# Patient Record
Sex: Male | Born: 1995 | Race: White | Hispanic: No | Marital: Single | Smoking: Former smoker
Health system: Southern US, Community
[De-identification: ages and names within clinical notes are randomized; demographics above are authoritative.]

## PROBLEM LIST (undated history)

## (undated) DIAGNOSIS — J45909 Unspecified asthma, uncomplicated: Secondary | ICD-10-CM

## (undated) HISTORY — PX: TONSILLECTOMY: SHX5217

## (undated) HISTORY — DX: Unspecified asthma, uncomplicated: J45.909

## (undated) HISTORY — PX: ADENOIDECTOMY: SUR15

---
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
## 2015-11-24 HISTORY — PX: OTHER SURGICAL HISTORY: SHX169

## 2020-04-16 NOTE — Progress Notes (Signed)
Phone: [phone]   Subjective:  Patient presents today to establish care.  Prior patient of PCP in [place]- does not recall name.  Chief Complaint  Patient presents with  . New Patient (Initial Visit)   See problem oriented charting  The following were reviewed and entered/updated in epic: Past Medical History:  Diagnosis Date  . Asthma    as a child   . Chiari I malformation (HCC) 2016   s/p decompression surgery with Dr. Thad Ranger In [place]. still does appointments there.    Patient Active Problem List   Diagnosis Date Noted  . Chiari I malformation (HCC) 2016   Past Surgical History:  Procedure Laterality Date  . ADENOIDECTOMY    . Chiari decompression  11/24/2015  . TONSILLECTOMY      Family History  Problem Relation Age of Onset  . Crohn's disease Mother   . Hypertension Father        not sure which it is HTN or HLD  . Hyperlipidemia Father        not close to father  . Hyperlipidemia Maternal Grandmother   . Other Maternal Grandfather        sepsis. and may have had heart related issues  . Other Paternal Grandmother        thinks pacemaker- limited relationship  . Other Paternal Grandfather        limited relationship    Medications- reviewed and updated Current Outpatient Medications  Medication Sig Dispense Refill  . ISOtretinoin (ACCUTANE) 40 MG capsule Take by mouth.    . triamcinolone (KENALOG) 0.1 % Apply 1 application topically 2 (two) times daily. For 7-10 days maximum 80 g 0   No current facility-administered medications for this visit.    Allergies-reviewed and updated Allergies  Allergen Reactions  . Amitriptyline     Other reaction(s):  auditory hallucinations after chiari malformation surgery  . Gabapentin     Other reaction(s): auditory hallucinations after chiari malformation surgery    Social History   Social History Narrative   Single. Lives with a roommate. 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    He is interested in Multimedia programmer estate and Production assistant, radio.       Hobbies: working out, time with friends, family, travel.     Objective  Objective:  BP 116/64   Pulse 67   Temp 98.5 F (36.9 C) (Temporal)   Ht 6' (1.829 m)   Wt 188 lb 6.4 oz (85.5 kg)   SpO2 95%   BMI 25.55 kg/m  Gen: NAD, resting comfortably Scar on back of scalp from prior Chiari malformation surgery noted HEENT: Mucous membranes are moist. Oropharynx normal. TM normal. Eyes: sclera and lids normal, PERRLA Neck: no thyromegaly, no cervical lymphadenopathy CV: RRR no murmurs rubs or gallops Lungs: CTAB no crackles, wheeze, rhonchi Abdomen: soft/nontender/nondistended/normal bowel sounds. No rebound or guarding.  Ext: no edema Skin: warm, dry, dry on back of bilateral hands with some excoriation Neuro: 5/5 strength in upper and lower extremities, normal gait, normal reflexes    Assessment and Plan:   #Bilateral hand dryness S Bilateral hand dryness- issues for about a week. History of eczema when younger- and gets heat rash at times too. ends up scratching while he sleeps. Hydrocortisone was helpful and seemed to help it scab over but wonders if drying it out. Also using aveeno with aloe.  A/P: Unclear etiology-potential irritant  at work.  No other new known contacts.  We will try to break out of itch scratch cycle-For the hands please apply triamcinolone twice daily and then cover with Aquaphor (reports breaking out with Vaseline in the past but has tolerated Aquaphor). Can reapply aquaphor if needed. Could have an irritant at work perhaps.  -Does have history of eczema as a child-may be a variant of that -Also discussed possibly referring back to his dermatologist if needed if fails to improve  #Insomnia #Fatigue S: Works 3- 11 PM and having a hard time getting sleep schedule on track- still exhausted during the day. Has been able to sleep 8 hours for 2 weeks but still  feels tired in the day. Tried modafinil in the past and seemed to help.   Fatigue during the day. 1-2 hours to get to sleep and may be on and off sleep.  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  I want to at least try melatonin 1 to 3 mg as an initial step and also per AVS did some counseling for sleep hygiene.  Also discussed possible referral to behavioral health for CBT-I. -We discussed if not improving within 2 or 3 weeks could try Ambien intermittently perhaps 2 or 3 days a week -He asks about Provigil but with prior auditory hallucinations this makes me more cautious  #Groin lesion -At base oft of penis has noted a small bump on and off like a pimple for a month- if taking turmeric seems to be better. Drank alcohol last night and seemed worse today.  On exam this appears to be an infected hair follicle-recommend warm compresses-given location do not think incision and drainage is ideal at this time.  No obvious inguinal lymphadenopathy.  if new or worsening symptoms should follow-up but I think this will gradually improve  #STD screening-patient reports using condoms regularly.  We opted for him to save 7 screen for STDs still.  On visual inspection no obvious STDs  #Chiari malformation-s/p surgery with Dr. Thad Ranger In [place]. still does appointments there.  Recommended patient continue regular follow-up -Apparently also has history of spinal fluid build up after chiari malformation surgery with Dr. Thad Ranger in [place]. had auditory hallucinations that resolved eventually.  We discussed potential causes-primarily simply thankful he is doing better.   Recommended follow up: 1 year physical unless other symptoms do not improve No future appointments.  Meds ordered this encounter  Medications  . triamcinolone (KENALOG) 0.1 %    Sig: Apply 1 application topically 2 (two) times daily. For 7-10 days maximum    Dispense:  80 g    Refill:  0   Time Spent: 49 minutes  of total time (11 AM-11:43 AM, 12: 41 PM-12:47 PM ) was spent on the date of the encounter performing the following actions: chart review prior to seeing the patient, obtaining history, performing a medically necessary exam, counseling on the treatment plan, placing orders, and documenting in our EHR.   Return precautions advised. Tana Conch, MD

## 2020-04-16 NOTE — Patient Instructions (Addendum)
Health Maintenance Due  Topic Date Due  . TETANUS/TDAP  Never done  . INFLUENZA VACCINE  Never done   Please stop by lab before you go If you have mychart- we will send your results within 3 business days of [place] receiving them.  If you do not have mychart- we will call you about results within 5 business days of [place] receiving them.  *please note we are currently using [facility] which has a longer processing time than [place] typically so labs may not come back as quickly as in the past *please also note that you will see labs on mychart as soon as they post. I will later go in and write notes on them- will say "notes from Dr. Durene Cal"  For the hands please apply triamcinolone twice daily and then cover with Aquaphor. Can reapply aquaphor if needed. Could have an irritant at work perhaps.   Try warm compresses on area in the groin- if this area gets bigger over time instead of improving- could refer to urology  Try melatonin in 1-3 mg range for up to 3 weeks before bed and see if that helps. If not effective call or send [place] a message on mychart.   Do not use your phone/computuer/tv 30 minutes to an hour before bed.  I like blue light glasses but even still better to just be off completely.   If you cant sleep after about 10 minutes get up and go to an area not in your bedroom until you feel sleepy- likely read a book.   Please call [phone] to schedule a visit with [facility] for insomnia related issues with Salomon Fick

## 2020-04-17 ENCOUNTER — Encounter: Payer: Self-pay | Admitting: Family Medicine

## 2020-04-17 ENCOUNTER — Other Ambulatory Visit (HOSPITAL_COMMUNITY)
Admission: RE | Admit: 2020-04-17 | Discharge: 2020-04-17 | Disposition: A | Discharge status: Home / Self Care | Payer: BLUE CROSS/BLUE SHIELD | Source: Ambulatory Visit | Attending: Family Medicine | Admitting: Family Medicine

## 2020-04-17 ENCOUNTER — Ambulatory Visit (INDEPENDENT_AMBULATORY_CARE_PROVIDER_SITE_OTHER): Payer: No Typology Code available for payment source | Admitting: Family Medicine

## 2020-04-17 ENCOUNTER — Other Ambulatory Visit: Payer: Self-pay

## 2020-04-17 VITALS — BP 116/64 | HR 67 | Temp 98.5°F | Ht 72.0 in | Wt 188.4 lb

## 2020-04-17 DIAGNOSIS — G935 Compression of brain: Secondary | ICD-10-CM

## 2020-04-17 DIAGNOSIS — Z114 Encounter for screening for human immunodeficiency virus [HIV]: Secondary | ICD-10-CM

## 2020-04-17 DIAGNOSIS — Z113 Encounter for screening for infections with a predominantly sexual mode of transmission: Secondary | ICD-10-CM

## 2020-04-17 DIAGNOSIS — R5383 Other fatigue: Secondary | ICD-10-CM

## 2020-04-17 DIAGNOSIS — Z118 Encounter for screening for other infectious and parasitic diseases: Secondary | ICD-10-CM

## 2020-04-17 DIAGNOSIS — Z13220 Encounter for screening for lipoid disorders: Secondary | ICD-10-CM

## 2020-04-17 DIAGNOSIS — Z1159 Encounter for screening for other viral diseases: Secondary | ICD-10-CM

## 2020-04-17 MED ORDER — TRIAMCINOLONE ACETONIDE 0.1 % EX CREA: 1.0000 "application " | TOPICAL_CREAM | Freq: Two times a day (BID) | CUTANEOUS | 0 refills | Status: DC

## 2020-04-17 NOTE — Addendum Note (Signed)
Addended by: Daryll Brod on: 04/17/2020 05:40 PM   Modules accepted: Orders

## 2020-04-20 LAB — URINE CYTOLOGY ANCILLARY ONLY
Chlamydia: NEGATIVE
Comment: NEGATIVE
Comment: NEGATIVE
Comment: NORMAL
Neisseria Gonorrhea: NEGATIVE
Trichomonas: NEGATIVE

## 2020-04-20 LAB — CBC WITH DIFFERENTIAL/PLATELET
Absolute Monocytes: 552 cells/uL (ref 200–950)
Basophils Absolute: 50 cells/uL (ref 0–200)
Basophils Relative: 0.8 %
Eosinophils Absolute: 87 cells/uL (ref 15–500)
Eosinophils Relative: 1.4 %
HCT: 43.3 % (ref 38.5–50.0)
Hemoglobin: 14.4 g/dL (ref 13.2–17.1)
Lymphs Abs: 2375 cells/uL (ref 850–3900)
MCH: 29.6 pg (ref 27.0–33.0)
MCHC: 33.3 g/dL (ref 32.0–36.0)
MCV: 89.1 fL (ref 80.0–100.0)
MPV: 8.9 fL (ref 7.5–12.5)
Monocytes Relative: 8.9 %
Neutro Abs: 3137 cells/uL (ref 1500–7800)
Neutrophils Relative %: 50.6 %
Platelets: 283 10*3/uL (ref 140–400)
RBC: 4.86 10*6/uL (ref 4.20–5.80)
RDW: 12.3 % (ref 11.0–15.0)
Total Lymphocyte: 38.3 %
WBC: 6.2 10*3/uL (ref 3.8–10.8)

## 2020-04-20 LAB — COMPLETE METABOLIC PANEL WITH GFR
AG Ratio: 1.8 (calc) (ref 1.0–2.5)
ALT: 25 U/L (ref 9–46)
AST: 54 U/L — ABNORMAL HIGH (ref 10–40)
Albumin: 4.3 g/dL (ref 3.6–5.1)
Alkaline phosphatase (APISO): 56 U/L (ref 36–130)
BUN: 22 mg/dL (ref 7–25)
CO2: 26 mmol/L (ref 20–32)
Calcium: 9.2 mg/dL (ref 8.6–10.3)
Chloride: 104 mmol/L (ref 98–110)
Creat: 1.01 mg/dL (ref 0.60–1.35)
GFR, Est African American: 120 mL/min/{1.73_m2} (ref >=60)
GFR, Est Non African American: 104 mL/min/{1.73_m2} (ref >=60)
Globulin: 2.4 g/dL (calc) (ref 1.9–3.7)
Glucose, Bld: 92 mg/dL (ref 65–99)
Potassium: 3.7 mmol/L (ref 3.5–5.3)
Sodium: 140 mmol/L (ref 135–146)
Total Bilirubin: 0.4 mg/dL (ref 0.2–1.2)
Total Protein: 6.7 g/dL (ref 6.1–8.1)

## 2020-04-20 LAB — LIPID PANEL W/REFLEX DIRECT LDL
Cholesterol: 176 mg/dL (ref <200)
HDL: 52 mg/dL (ref >=40)
LDL Cholesterol (Calc): 108 mg/dL (calc) — ABNORMAL HIGH
Non-HDL Cholesterol (Calc): 124 mg/dL (calc) (ref <130)
Total CHOL/HDL Ratio: 3.4 (calc) (ref <5.0)
Triglycerides: 72 mg/dL (ref <150)

## 2020-04-20 LAB — RPR: RPR Ser Ql: NONREACTIVE

## 2020-04-20 LAB — HIV ANTIBODY (ROUTINE TESTING W REFLEX): HIV 1&2 Ab, 4th Generation: NONREACTIVE

## 2020-04-20 LAB — TSH: TSH: 0.68 mIU/L (ref 0.40–4.50)

## 2020-04-20 LAB — HEPATITIS C ANTIBODY
Hepatitis C Ab: NONREACTIVE
SIGNAL TO CUT-OFF: 0.11 (ref <1.00)

## 2020-04-21 ENCOUNTER — Other Ambulatory Visit: Payer: Self-pay

## 2020-04-21 DIAGNOSIS — R7989 Other specified abnormal findings of blood chemistry: Secondary | ICD-10-CM

## 2020-05-05 ENCOUNTER — Telehealth: Payer: Self-pay

## 2020-05-05 NOTE — Telephone Encounter (Signed)
I cannot write this without having evaluated patient-please apologize to him for us-if he needs a work note in the future will need a visit.  If he was feeling "under the weather" he may also need to be tested for Covid depending on symptomatology

## 2020-05-05 NOTE — Telephone Encounter (Signed)
Patient is requesting a note to go back to work patient said he was just feeling under the weather and took today off but needs a note saying he is able to come back to work tomorrow

## 2020-05-05 NOTE — Telephone Encounter (Signed)
Is it okay for me to write this note for the patient ?

## 2020-05-06 NOTE — Telephone Encounter (Signed)
Patient called back and is scheduled for virtual with Dr.Parker tomorrow.

## 2020-05-06 NOTE — Telephone Encounter (Signed)
Patient is scheduled to see Dr. Jimmey Ralph tomorrow at 11:00 am.

## 2020-05-06 NOTE — Telephone Encounter (Signed)
Called and lm for pt tcb. 

## 2020-05-07 ENCOUNTER — Telehealth (INDEPENDENT_AMBULATORY_CARE_PROVIDER_SITE_OTHER): Payer: No Typology Code available for payment source | Admitting: Family Medicine

## 2020-05-07 ENCOUNTER — Encounter: Payer: Self-pay | Admitting: Family Medicine

## 2020-05-07 VITALS — Ht 72.0 in | Wt 195.0 lb

## 2020-05-07 DIAGNOSIS — R5381 Other malaise: Secondary | ICD-10-CM

## 2020-05-07 NOTE — Progress Notes (Signed)
   Jeffrey Frost is a 24 y.o. male who presents today for a virtual office visit.  Assessment/Plan:  Malaise Without fever, cough, or shortness of breath doubt that this represents Covid or other viral infection.  Additionally patient states that he was infected with Covid last year.  Symptoms have improved significantly.  Given extremely low likelihood for Covid at this point.  Negative is reasonable for him to return to work.  Will give work note stating this.  He can continue over-the-counter meds as needed.  Discussed reasons to return to care.     Subjective:  HPI:  Patient here with malaise for the past 4 days.  Overall felt exhausted.  More fatigue and headache.  He has been taking many multivitamins.  No specific treatments tried.  No fevers.  No cough or shortness of breath.  No chills. No known sick contacts.  He needs a note to go back to work.  Symptoms have completely resolved.       Objective/Observations  Physical Exam: Gen: NAD, resting comfortably Pulm: Normal work of breathing Neuro: Grossly normal, moves all extremities Psych: Normal affect and thought content  Virtual Visit via Video   I connected with Jeffrey Frost on 05/07/20 at 11:00 AM EST by a video enabled telemedicine application and verified that I am speaking with the correct person using two identifiers. The limitations of evaluation and management by telemedicine and the availability of in person appointments were discussed. The patient expressed understanding and agreed to proceed.   Patient location: Home Provider location: [place] [facility] Persons participating in the virtual visit: Myself and Patient     Katina Degree. Jimmey Ralph, MD 05/07/2020 11:14 AM

## 2020-05-29 ENCOUNTER — Ambulatory Visit: Payer: No Typology Code available for payment source | Admitting: Family Medicine

## 2020-05-29 ENCOUNTER — Ambulatory Visit (INDEPENDENT_AMBULATORY_CARE_PROVIDER_SITE_OTHER): Payer: Worker's Compensation

## 2020-05-29 ENCOUNTER — Ambulatory Visit: Payer: Self-pay

## 2020-05-29 ENCOUNTER — Encounter: Payer: Self-pay | Admitting: Family Medicine

## 2020-05-29 ENCOUNTER — Other Ambulatory Visit: Payer: Self-pay

## 2020-05-29 VITALS — BP 110/68 | HR 93 | Ht 72.0 in | Wt 183.0 lb

## 2020-05-29 DIAGNOSIS — M25572 Pain in left ankle and joints of left foot: Secondary | ICD-10-CM | POA: Diagnosis not present

## 2020-05-29 DIAGNOSIS — S93492A Sprain of other ligament of left ankle, initial encounter: Secondary | ICD-10-CM | POA: Diagnosis not present

## 2020-05-29 MED ORDER — NAPROXEN 500 MG PO TABS: 500.0000 mg | ORAL_TABLET | Freq: Two times a day (BID) | ORAL | 3 refills | Status: DC | PRN

## 2020-05-29 NOTE — Patient Instructions (Addendum)
Thank you for coming in today.  Please do the exercises discussed in the office.  Please perform the exercise program that we have prepared for you and gone over in detail on a daily basis.  In addition to the handout you were provided you can access your program through: [email or web address]   Your unique program code is:  [email or web address]  Please go to [facility] supply to get the [employer] we talked about today. You may also be able to get it from [employer].   Recheck in 7-14 days.    Ankle Sprain  An ankle sprain is a stretch or tear in one of the tough tissues (ligaments) that connect the bones in your ankle. An ankle sprain can happen when the ankle rolls outward (inversion sprain) or inward (eversion sprain). What are the causes? This condition is caused by rolling or twisting the ankle. What increases the risk? You are more likely to develop this condition if you play sports. What are the signs or symptoms? Symptoms of this condition include:  Pain in your ankle.  Swelling.  Bruising. This may happen right after you sprain your ankle or 1-2 days later.  Trouble standing or walking. How is this diagnosed? This condition is diagnosed with:  A physical exam. During the exam, your doctor will press on certain parts of your foot and ankle and try to move them in certain ways.  X-ray imaging. These may be taken to see how bad the sprain is and to check for broken bones. How is this treated? This condition may be treated with:  A brace or splint. This is used to keep the ankle from moving until it heals.  An elastic bandage. This is used to support the ankle.  Crutches.  Pain medicine.  Surgery. This may be needed if the sprain is very bad.  Physical therapy. This may help to improve movement in the ankle. Follow these instructions at home: If you have a brace or a splint:  Wear the brace or splint as told by your doctor. Remove it only as told by your  doctor.  Loosen the brace or splint if your toes: ? Tingle. ? Lose feeling (become numb). ? Turn cold and blue.  Keep the brace or splint clean.  If the brace or splint is not waterproof: ? Do not let it get wet. ? Cover it with a watertight covering when you take a bath or a shower. If you have an elastic bandage (dressing):  Remove it to shower or bathe.  Try not to move your ankle much, but wiggle your toes from time to time. This helps to prevent swelling.  Adjust the dressing if it feels too tight.  Loosen the dressing if your foot: ? Loses feeling. ? Tingles. ? Becomes cold and blue. Managing pain, stiffness, and swelling  Take over-the-counter and prescription medicines only as told by doctor.  For 2-3 days, keep your ankle raised (elevated) above the level of your heart.  If told, put ice on the injured area: ? If you have a removable brace or splint, remove it as told by your doctor. ? Put ice in a plastic bag. ? Place a towel between your skin and the bag. ? Leave the ice on for 20 minutes, 2-3 times a day.   General instructions  Rest your ankle.  Do not use your injured leg to support your body weight until your doctor says that you can. Use crutches as told by your  doctor.  Do not use any products that contain nicotine or tobacco, such as cigarettes, e-cigarettes, and chewing tobacco. If you need help quitting, ask your doctor.  Keep all follow-up visits as told by your doctor. Contact a doctor if:  Your bruises or swelling are quickly getting worse.  Your pain does not get better after you take medicine. Get help right away if:  You cannot feel your toes or foot.  Your foot or toes look blue.  You have very bad pain that gets worse. Summary  An ankle sprain is a stretch or tear in one of the tough tissues (ligaments) that connect the bones in your ankle.  This condition is caused by rolling or twisting the ankle.  Symptoms include pain,  swelling, bruising, and trouble walking.  To help with pain and swelling, put ice on the injured ankle, raise your ankle above the level of your heart, and use an elastic bandage. Also, rest as told by your doctor.  Keep all follow-up visits as told by your doctor. This is important. This information is not intended to replace advice given to you by your health care provider. Make sure you discuss any questions you have with your health care provider. Document Revised: 09/26/2017 Document Reviewed: 09/26/2017 Elsevier Patient Education  2021 [employer].

## 2020-05-29 NOTE — Progress Notes (Signed)
    Subjective:    CC: L ankle pain  I, Molly Weber, LAT, ATC, am serving as scribe for Dr. Clementeen Graham.  HPI: Pt is a 25 y/o male presenting w/ c/o L ankle pain after injuring his ankle last night when he rolled his L ankle into inversion as he was stepping down from a ladder.  He locates his pain to his L ant-lat ankle.  He reports a hx of prior L ankle sprains.  Injury occurred at work.  He works as a Data processing manager for [employer].  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  Ankle swelling: swelling Instability: yes Aggravating factors: walking; L ankle PF and inv AROM Treatments tried: compression; ice;   Pertinent review of Systems: No fevers or chills  Relevant historical information: Chiari I malformation   Objective:    Vitals:   05/29/20 1045  BP: 110/68  Pulse: 93  SpO2: 96%   General: Well Developed, well nourished, and in no acute distress.   MSK: Left ankle swollen at the area anterior and superior to the lateral malleolus. Tender palpation at syndesmosis and ATFL region. Decreased ankle motion to dorsiflexion with pain. Stability and strength not tested due to guarding and pain.   Pulses cap refill and sensation are intact distally.  Lab and Radiology Results  X-ray images left ankle obtained today personally independently interpreted.  No fractures visible.  No widening of the syndesmosis.  Soft tissue swelling present. Await formal radiology review   Impression and Recommendations:    Assessment and Plan: 25 y.o. male with left ankle sprain.  Likely syndesmosis injury or high ankle sprain.  Patient has quite a bit of pain and difficulty walking.  We will recommend using a cam walker boot.  I do not think patient will be able to work currently with his current pain and disability.  Work note written.  We will recheck next week.  Start home exercise program.  Refer to physical therapy.  Recommend patient contact his employer and file this is  Worker's Comp..  Naproxen prescribed.  PDMP not reviewed this encounter. Orders Placed This Encounter  Procedures  . DG Ankle Complete Left    Standing Status:   Future    Number of Occurrences:   1    Standing Expiration Date:   06/29/2020    Order Specific Question:   Reason for Exam (SYMPTOM  OR DIAGNOSIS REQUIRED)    Answer:   L ankle pain    Order Specific Question:   Preferred imaging location?    Answer:   [facility]  . Ambulatory referral to Physical Therapy    Referral Priority:   Routine    Referral Type:   Physical Medicine    Referral Reason:   Specialty Services Required    Requested Specialty:   Physical Therapy   Meds ordered this encounter  Medications  . naproxen (NAPROSYN) 500 MG tablet    Sig: Take 1 tablet (500 mg total) by mouth 2 (two) times daily as needed for moderate pain.    Dispense:  30 tablet    Refill:  3    Discussed warning signs or symptoms. Please see discharge instructions. Patient expresses understanding.   The above documentation has been reviewed and is accurate and complete Clementeen Graham, M.D.

## 2020-05-30 NOTE — Progress Notes (Signed)
No fracture seen on Xray

## 2020-06-04 NOTE — Progress Notes (Signed)
I, Christoper Fabian, LAT, ATC, am serving as scribe for Dr. Clementeen Graham.  Jeffrey Frost is a 25 y.o. male who presents to [facility] today for f/u L syndesmotic ankle sprain after he rolled his L ankle into inversion as he was stepping down from a ladder on 05/28/20. Pt was last seen by Dr. Denyse Amass on 05/29/20 and was provided w/ a HEP focusing on L ankle ROM and strengthening.  He was also referred to PT of which he's completed no visits as he has not been contacted by them to schedule.  He was also prescribed Naproxen and advised to wear a CAM walking boot.  Since his last visit, pt reports that his L ankle is doing better but he con't to lack L ankle ROM.  He also con't to feel like his L ankle is unstable.  He con't to have swelling in his L ankle.  He has been wearing his boot and doing his HEP consistently.  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  Dx imaging: 05/29/20 L ankle XR  Pertinent review of systems: No fevers or chills  Relevant historical information: Chiari malformation with surgery history.   Exam:  BP 102/68 (BP Location: Right Arm, Patient Position: Sitting, Cuff Size: Normal)    Pulse 75    Ht 6' (1.829 m)    Wt 193 lb 12.8 oz (87.9 kg)    SpO2 97%    BMI 26.28 kg/m  General: Well Developed, well nourished, and in no acute distress.   MSK: Left ankle swollen at anterior lateral ankle. Tender to palpation syndesmosis and ATFL region. Decreased motion. Slight laxity to talar tilt testing.  No anterior drawer laxity. Left foot swollen over dorsal midfoot.  Tender palpation dorsal midfoot. Pulses cap refill and sensation are intact into the foot.    Lab and Radiology Results  X-ray images left ankle and left foot obtained today personally and independently interpreted  Left ankle: No fractures visible.  Soft tissue swelling.  Left foot: No fractures visible.  Soft tissue swelling.  Await  formal radiology review    Assessment and Plan: 25 y.o. male with left ankle sprain follow-up.  Slight improvement but still very symptomatic.  Patient remains in cam walker boot.  Plan for repeat x-ray given continued significant symptoms.  We will go ahead and x-ray foot now as well since he is having more pain into the foot.  Stressed the importance of proceeding with physical therapy.  Patient will call and schedule.  Work: Plan on attempted return to work on January 31 if his work will allow him to use a Personal assistant at work.  If not return to work planned on February 14.  Recheck in clinic in 3 weeks.  Recheck or contact me sooner if not improving or worsening.   PDMP not reviewed this encounter. Orders Placed This Encounter  Procedures   DG Foot Complete Left    Standing Status:   Future    Number of Occurrences:   1    Standing Expiration Date:   06/05/2021    Order Specific Question:   Reason for Exam (SYMPTOM  OR DIAGNOSIS REQUIRED)    Answer:   eval foot pain after ankle sprain    Order Specific Question:   Preferred imaging location?    [facility]   DG Ankle Complete Left    Standing Status:  Future    Number of Occurrences:   1    Standing Expiration Date:   06/05/2021    Order Specific Question:   Reason for Exam (SYMPTOM  OR DIAGNOSIS REQUIRED)    Answer:   eval ankle pain after sprain    Order Specific Question:   Preferred imaging location?    Answer:   [facility]   No orders of the defined types were placed in this encounter.    Discussed warning signs or symptoms. Please see discharge instructions. Patient expresses understanding.   The above documentation has been reviewed and is accurate and complete Clementeen Graham, M.D.

## 2020-06-05 ENCOUNTER — Other Ambulatory Visit: Payer: Self-pay

## 2020-06-05 ENCOUNTER — Encounter: Payer: Self-pay | Admitting: Family Medicine

## 2020-06-05 ENCOUNTER — Ambulatory Visit (INDEPENDENT_AMBULATORY_CARE_PROVIDER_SITE_OTHER): Payer: Worker's Compensation

## 2020-06-05 ENCOUNTER — Ambulatory Visit (INDEPENDENT_AMBULATORY_CARE_PROVIDER_SITE_OTHER): Payer: Worker's Compensation | Admitting: Family Medicine

## 2020-06-05 VITALS — BP 102/68 | HR 75 | Ht 72.0 in | Wt 193.8 lb

## 2020-06-05 DIAGNOSIS — M25572 Pain in left ankle and joints of left foot: Secondary | ICD-10-CM

## 2020-06-05 DIAGNOSIS — S93492D Sprain of other ligament of left ankle, subsequent encounter: Secondary | ICD-10-CM

## 2020-06-05 DIAGNOSIS — M79672 Pain in left foot: Secondary | ICD-10-CM

## 2020-06-05 NOTE — Patient Instructions (Addendum)
You have been referred to PT at [facility] [place].  Please call them to schedule at [phone].  Plan for PT.   Recheck in 3 weeks.   Recheck sooner if needed.   Continue the boot.

## 2020-06-08 ENCOUNTER — Telehealth: Payer: Self-pay | Admitting: Family Medicine

## 2020-06-08 ENCOUNTER — Encounter: Payer: Self-pay | Admitting: Family Medicine

## 2020-06-08 NOTE — Telephone Encounter (Signed)
Patient called stating that his work is not able to accomodate him while he is wearing a boot. He asked if another letter could be written keeping him out of work until 06/29/2020.  Please advise.  - Patient asked that the letter be added to his MyChart.

## 2020-06-08 NOTE — Progress Notes (Signed)
Left ankle x-ray normal per radiology

## 2020-06-08 NOTE — Telephone Encounter (Signed)
Done

## 2020-06-08 NOTE — Progress Notes (Signed)
Left foot x-ray is normal to radiology

## 2020-06-09 ENCOUNTER — Encounter: Payer: Self-pay | Admitting: Physical Therapy

## 2020-06-09 ENCOUNTER — Ambulatory Visit (INDEPENDENT_AMBULATORY_CARE_PROVIDER_SITE_OTHER): Payer: No Typology Code available for payment source | Admitting: Physical Therapy

## 2020-06-09 ENCOUNTER — Other Ambulatory Visit: Payer: Self-pay

## 2020-06-09 DIAGNOSIS — M25572 Pain in left ankle and joints of left foot: Secondary | ICD-10-CM | POA: Diagnosis not present

## 2020-06-09 NOTE — Patient Instructions (Signed)
Access Code: [email or web address] URL: [email or web address] Date: 06/09/2020 Prepared by: Sedalia Muta  Exercises Ankle and Toe Plantarflexion with Resistance - 1-2 x daily - 2 sets - 10 reps Long Sitting Ankle Dorsiflexion with Anchored Resistance - 2 x daily - 1-2 sets - 10 reps Supine Ankle Inversion Eversion AROM - 2 x daily - 2 sets - 10 reps Seated Heel Raise - 2 x daily - 2 sets - 10 reps Heel rises with counter support - 2 x daily - 2 sets - 10 reps Single Leg Stance - 2 x daily - 3 reps - 30 hold

## 2020-06-10 NOTE — Progress Notes (Signed)
Phone [phone] In person visit   Subjective:   Guy Seese is a 25 y.o. year old very pleasant male patient who presents for/with See problem oriented charting Chief Complaint  Patient presents with  . Medication Management    Patient just wants to do a follow up on his medication      This visit occurred during the SARS-CoV-2 public health emergency.  Safety protocols were in place, including screening questions prior to the visit, additional usage of staff PPE, and extensive cleaning of exam room while observing appropriate contact time as indicated for disinfecting solutions.   Past Medical History-  Patient Active Problem List   Diagnosis Date Noted  . High ankle sprain, left, initial encounter 05/29/2020  . Chiari I malformation (HCC) 2016    Medications- reviewed and updated Current Outpatient Medications  Medication Sig Dispense Refill  . Ascorbic Acid (VITAMIN C) 500 MG CAPS Take 1 capsule by mouth as needed.    . ISOtretinoin (ACCUTANE) 40 MG capsule Take by mouth.    . naproxen (NAPROSYN) 500 MG tablet Take 1 tablet (500 mg total) by mouth 2 (two) times daily as needed for moderate pain. 30 tablet 3   No current facility-administered medications for this visit.     Objective:  BP 136/84   Pulse (!) 58   Temp 98.1 F (36.7 C) (Temporal)   Ht 6' (1.829 m)   Wt 191 lb 6.4 oz (86.8 kg)   SpO2 99%   BMI 25.96 kg/m  Gen: NAD, resting comfortably    Assessment and Plan   # Insomnia S:melatonin helped him sleep- did it twice now    Has been off work due to his left leg injury. When goes back to work will be 7 Am- 3 30 PM. He thinks more consistent sleep schedule will help.  A/P: discussed sleep hygienes. Prn melatonin ok- consistent sleep schedule would help.    # Focus concerns/inattention S:patient states has had trouble with focus. Thinks sleep and stress issues would be helpful. Patient states has had forgetfulness from young age. Would have trouble  with reading- did better with listening but once again could still have forgetfulness. Was not told hyperactive when younger. Issues have been ongoing and present before surgery.  A/P: Possible ADD- offered referral to psychiatry but wants to try home options first. Also could be from having added stresses.   Options that may help with focus 1. The Smart but scattered guide to success-an excellent self-help book on focus 2. Chadd.org 3. ADDItudemag.com   # hands cleared up even without steroid last visit- doing much better overall but did have some itching last night- worse if he wears a watch- - can be different bands such as garmin instinct- could have sensitivity to plastic.   #last visit 05/07/18 with 2 year follow up Dr. Jethro Bastos neurosurgery [place]. Plans to go to [place] when weather is better  #working with Dr. Noralyn Pick on ankle and has follow up with Dr. Denyse Amass on February 11th.   Recommended follow up: Return in about 1 year (around 06/11/2021) for physical or sooner if needed. Future Appointments  Date Time Provider Department Center  06/16/2020  8:45 AM Sedalia Muta, PT [facility]  06/18/2020  8:45 AM Sedalia Muta, PT [facility]  06/22/2020  9:00 AM [facility]  06/23/2020  8:45 AM Sedalia Muta, PT [facility]  06/25/2020  8:45 AM Sedalia Muta, PT [facility]  06/26/2020 10:00 AM Rodolph Bong, MD  [facility]  06/30/2020  8:45 AM Sedalia Muta, PT [facility]   Lab/Order associations:   ICD-10-CM   1. Insomnia, unspecified type  G47.00   2. Inattention  R41.840    Time Spent: 18 minutes of total time (9:38 AM- 9:56AM) was spent on the date of the encounter performing the following actions: chart review prior to seeing the patient, obtaining history, performing a medically necessary exam, counseling on the treatment plan, placing orders, and documenting in our EHR.   Return precautions advised.  Tana Conch, MD

## 2020-06-10 NOTE — Patient Instructions (Addendum)
Health Maintenance Due  Topic Date Due  . TETANUS/TDAP- today Never done   Options that may help with focus 1. The Smart but scattered guide to success-an excellent self-help book on focus 2. Chadd.org 3. ADDItudemag.com  - if these aren't helpful- we can refer to psychiatry. Also reducing stress can sometimes help with focus- lots of therapists available locally if needed  Recommended follow up: Return in about 1 year (around 06/11/2021) for physical or sooner if needed.

## 2020-06-11 ENCOUNTER — Other Ambulatory Visit: Payer: Self-pay

## 2020-06-11 ENCOUNTER — Encounter: Payer: Self-pay | Admitting: Family Medicine

## 2020-06-11 ENCOUNTER — Ambulatory Visit (INDEPENDENT_AMBULATORY_CARE_PROVIDER_SITE_OTHER): Payer: Worker's Compensation | Admitting: Family Medicine

## 2020-06-11 VITALS — BP 136/84 | HR 58 | Temp 98.1°F | Ht 72.0 in | Wt 191.4 lb

## 2020-06-11 DIAGNOSIS — G4700 Insomnia, unspecified: Secondary | ICD-10-CM | POA: Diagnosis not present

## 2020-06-11 DIAGNOSIS — R41840 Attention and concentration deficit: Secondary | ICD-10-CM

## 2020-06-11 DIAGNOSIS — R7989 Other specified abnormal findings of blood chemistry: Secondary | ICD-10-CM | POA: Diagnosis not present

## 2020-06-11 DIAGNOSIS — Z23 Encounter for immunization: Secondary | ICD-10-CM | POA: Diagnosis not present

## 2020-06-11 LAB — COMPLETE METABOLIC PANEL WITH GFR
AG Ratio: 1.6 (calc) (ref 1.0–2.5)
ALT: 77 U/L — ABNORMAL HIGH (ref 9–46)
AST: 58 U/L — ABNORMAL HIGH (ref 10–40)
Albumin: 4.4 g/dL (ref 3.6–5.1)
Alkaline phosphatase (APISO): 60 U/L (ref 36–130)
BUN: 14 mg/dL (ref 7–25)
CO2: 28 mmol/L (ref 20–32)
Calcium: 9.6 mg/dL (ref 8.6–10.3)
Chloride: 104 mmol/L (ref 98–110)
Creat: 0.87 mg/dL (ref 0.60–1.35)
GFR, Est African American: 140 mL/min/{1.73_m2} (ref >=60)
GFR, Est Non African American: 121 mL/min/{1.73_m2} (ref >=60)
Globulin: 2.7 g/dL (calc) (ref 1.9–3.7)
Glucose, Bld: 84 mg/dL (ref 65–99)
Potassium: 4.4 mmol/L (ref 3.5–5.3)
Sodium: 140 mmol/L (ref 135–146)
Total Bilirubin: 0.3 mg/dL (ref 0.2–1.2)
Total Protein: 7.1 g/dL (ref 6.1–8.1)

## 2020-06-11 NOTE — Addendum Note (Signed)
Addended by: Daryll Brod on: 06/11/2020 10:19 AM   Modules accepted: Orders

## 2020-06-11 NOTE — Addendum Note (Signed)
Addended by: Cleda Mccreedy F on: 06/11/2020 10:12 AM   Modules accepted: Orders

## 2020-06-12 ENCOUNTER — Other Ambulatory Visit: Payer: Self-pay

## 2020-06-12 DIAGNOSIS — R7989 Other specified abnormal findings of blood chemistry: Secondary | ICD-10-CM

## 2020-06-16 ENCOUNTER — Encounter: Payer: No Typology Code available for payment source | Admitting: Physical Therapy

## 2020-06-18 ENCOUNTER — Other Ambulatory Visit: Payer: Self-pay

## 2020-06-18 ENCOUNTER — Ambulatory Visit: Payer: No Typology Code available for payment source | Admitting: Physical Therapy

## 2020-06-18 DIAGNOSIS — M25572 Pain in left ankle and joints of left foot: Secondary | ICD-10-CM

## 2020-06-21 ENCOUNTER — Encounter: Payer: Self-pay | Admitting: Physical Therapy

## 2020-06-21 NOTE — Therapy (Signed)
[facility] [place] [facility] [address] [place], [place], [address] Phone: [phone]   Fax:  [phone]  Physical Therapy Evaluation  Patient Details  Name: Gonsalo Cuthbertson MRN: [id number] Date of Birth: Mar 20, 1996 Referring Provider (PT): Clementeen Graham   Encounter Date: 06/09/2020   PT End of Session - 06/21/20 0818    Visit Number 1    Number of Visits 12    Date for PT Re-Evaluation 07/21/20    Authorization Type [employer]    PT Start Time 0805    PT Stop Time 0845    PT Time Calculation (min) 40 min    Activity Tolerance Patient tolerated treatment well    Behavior During Therapy [facility] for tasks assessed/performed           Past Medical History:  Diagnosis Date  . Asthma    as a child   . Chiari I malformation (HCC) 2016   s/p decompression surgery with Dr. Thad Ranger In [place]. still does appointments there.     Past Surgical History:  Procedure Laterality Date  . ADENOIDECTOMY    . Chiari decompression  11/24/2015  . TONSILLECTOMY      There were no vitals filed for this visit.    Subjective Assessment - 06/21/20 0814    Subjective Pt has pain in L ankle. 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 Has been out of work at this time. plan to return on Feb 14. Wearing CAM boot.    Pertinent History 2017 brain surgery for chiari malformation    Limitations Lifting;Standing;Walking;House hold activities    Patient Stated Goals Decreased pain, return to work    Currently in Pain? Yes    Pain Score 7     Pain Location Ankle    Pain Orientation Left    Pain Descriptors / Indicators Aching    Pain Type Acute pain    Pain Onset More than a month ago    Pain Frequency Intermittent    Aggravating Factors  standing, walking,              [facility] PT Assessment - 06/21/20 0001      Assessment   Medical Diagnosis L ankle pain/sprain    Referring Provider (PT) Clementeen Graham    Onset Date/Surgical Date 05/28/20     Prior Therapy no      Precautions   Precaution Comments Wearing CAM boot      Restrictions   Weight Bearing Restrictions No      Balance Screen   Has the patient fallen in the past 6 months Yes    How many times? 1    Has the patient had a decrease in activity level because of a fear of falling?  No    Is the patient reluctant to leave their home because of a fear of falling?  No      Prior Function   Level of Independence Independent      Cognition   Overall Cognitive Status Within Functional Limits for tasks assessed      ROM / Strength   AROM / PROM / Strength AROM;Strength      AROM   Overall AROM Comments L ankle: WFL, mild pain with DF, INV and EV laterally      Strength   Overall Strength Comments DF: 4+/5, INV/EV: 4/5 (pain),  PF: 4/5      Palpation   Palpation comment Pain at lateral ankle, around lateral malleolus and ATFL.  Special Tests   Other special tests SLS: 15 sec L;   Functional squat: pain;      Ambulation/Gait   Gait Comments minimal deficit, but pain in lateral ankle with ambulation without boot.                      Objective measurements completed on examination: See above findings.       [facility] Adult PT Treatment/Exercise - 06/21/20 0001      Manual Therapy   Manual Therapy Joint mobilization;Passive ROM    Joint Mobilization post fib glides gr 3; STJ post glides to inc DF.      Ankle Exercises: Stretches   Gastroc Stretch 2 reps;30 seconds    Gastroc Stretch Limitations standing at wall      Ankle Exercises: Standing   Heel Raises 15 reps    Heel Raises Limitations education on form      Ankle Exercises: Seated   Heel Raises 15 reps    Heel Raises Limitations education on form      Ankle Exercises: Supine   Other Supine Ankle Exercises PF/DF with RTB x 15:  INV/EV ROM x 15 ;                  PT Education - 06/21/20 2992    Education Details PT POC, Exam findings, HEP, discussed boot , shoe wearing  and return to work.    Person(s) Educated Patient    Methods Explanation;Demonstration;Tactile cues;Verbal cues;Handout    Comprehension Verbalized understanding;Returned demonstration;Verbal cues required;Tactile cues required;Need further instruction            PT Short Term Goals - 06/21/20 4268      PT SHORT TERM GOAL #1   Title Pt to be independent with initial HEP    Time 2    Period Weeks    Status New    Target Date 06/23/20             PT Long Term Goals - 06/21/20 0822      PT LONG TERM GOAL #1   Title Pt to be independent wtih final HEP    Time 6    Period Weeks    Status New    Target Date 07/21/20      PT LONG TERM GOAL #2   Title Pt to demo strength of L ankle to be at least 4+/5 to improve stability and gait    Time 6    Period Weeks    Status New    Target Date 07/21/20      PT LONG TERM GOAL #3   Title Pt to report decreased pain in ankle to 0-/10 with standing, walking, and dynamic activity, for ability to return to work.    Time 6    Period Weeks    Status New    Target Date 07/21/20      PT LONG TERM GOAL #4   Title Pt to demo stability of L ankle to be WNL for single leg and dynamic movements for safe return to work.    Time 6    Period Weeks    Status New    Target Date 07/21/20                  Plan - 06/21/20 0827    Clinical Impression Statement Pt presents with primary complaint of increased pain in L ankle, following injury at work. Pt with soreness at lateral ankle  with palpation and with activity. Pt with decreased tolerance for standing, walking and exercise, due to deficits and pain. Pt with decreased strength and stability of ankle. He is out of work at this time, due to deficits and work requirements but hoping to reutrn very soon. Discussed boot wearing and weaning out as tolerated. Pt to benefit from skilled PT to improve deficits and pain.    Examination-Activity Limitations Locomotion  Level;Carry;Squat;Stairs;Stand;Lift    Examination-Participation Restrictions Occupation;Cleaning;Community Activity;Driving;Meal Prep    Stability/Clinical Decision Making Stable/Uncomplicated    Clinical Decision Making Low    Rehab Potential Good    PT Frequency 2x / week    PT Duration 6 weeks    PT Treatment/Interventions ADLs/Self Care Home Management;Canalith Repostioning;Cryotherapy;Psychologist, educational;Iontophoresis 4mg /ml Dexamethasone;Moist Heat;Traction;Ultrasound;Balance training;Therapeutic exercise;Therapeutic activities;Functional mobility training;Stair training;Gait training;Cognitive remediation;Patient/family education;Orthotic Fit/Training;Passive range of motion;Manual techniques;Dry needling;Splinting;Taping;Vasopneumatic Device;Spinal Manipulations;Joint Manipulations    PT Home Exercise Plan [email or web address]    Consulted and Agree with Plan of Care Patient           Patient will benefit from skilled therapeutic intervention in order to improve the following deficits and impairments:  Abnormal gait,Decreased range of motion,Increased muscle spasms,Decreased activity tolerance,Pain,Decreased balance,Impaired flexibility,Decreased strength,Decreased mobility,Increased edema  Visit Diagnosis: Pain in left ankle and joints of left foot     Problem List Patient Active Problem List   Diagnosis Date Noted  . High ankle sprain, left, initial encounter 05/29/2020  . Chiari I malformation (HCC) 2016    2017, PT, DPT 8:39 AM  06/21/20    [facility] [address] [place], [place], [place] Phone: [phone]   Fax:  [phone]  Name: Jaydin Boniface MRN: Raford Pitcher Date of Birth: 1995/07/07

## 2020-06-21 NOTE — Therapy (Signed)
[facility] [place] [facility] [address] [place], [place], [address] Phone: [phone]   Fax:  [phone]  Physical Therapy Treatment  Patient Details  Name: Emmanuell Kantz MRN: [id number] Date of Birth: 1995/09/12 Referring Provider (PT): Clementeen Graham   Encounter Date: 06/18/2020   PT End of Session - 06/21/20 0851    Visit Number 2    Number of Visits 12    Date for PT Re-Evaluation 07/21/20    Authorization Type [employer]    PT Start Time [phone]    PT Stop Time 0930    PT Time Calculation (min) 38 min    Activity Tolerance Patient tolerated treatment well    Behavior During Therapy [facility] for tasks assessed/performed           Past Medical History:  Diagnosis Date  . Asthma    as a child   . Chiari I malformation (HCC) 2016   s/p decompression surgery with Dr. Thad Ranger In [place]. still does appointments there.     Past Surgical History:  Procedure Laterality Date  . ADENOIDECTOMY    . Chiari decompression  11/24/2015  . TONSILLECTOMY      There were no vitals filed for this visit.   Subjective Assessment - 06/21/20 0845    Subjective Pt states less pain in ankle today than last week. Has been doing exercises.    Currently in Pain? Yes    Pain Score 4     Pain Location Ankle    Pain Orientation Left    Pain Descriptors / Indicators Aching    Pain Type Acute pain    Pain Onset 1 to 4 weeks ago    Pain Frequency Intermittent                             [facility] Adult PT Treatment/Exercise - 06/21/20 0853      Exercises   Exercises Ankle      Ankle Exercises: Stretches   Soleus Stretch 5 reps;10 seconds    Soleus Stretch Limitations at wall/glides    Gastroc Stretch 2 reps;30 seconds    Gastroc Stretch Limitations standing at wall      Ankle Exercises: Aerobic   Recumbent Bike L2 x 8 min ;      Ankle Exercises: Standing   SLS 30 sec x 2;  WIth head turns 2x10 bil;    Heel Raises 20 reps    Other  Standing Ankle Exercises Squats x 20;      Ankle Exercises: Seated   Heel Raises 15 reps      Ankle Exercises: Supine   Other Supine Ankle Exercises ankle 4 way, RTB x 20;                  PT Education - 06/21/20 0850    Education Details Reviewed HEP    Person(s) Educated Patient    Methods Explanation;Demonstration;Tactile cues;Verbal cues;Handout    Comprehension Verbalized understanding;Returned demonstration;Verbal cues required;Tactile cues required;Need further instruction            PT Short Term Goals - 06/21/20 6761      PT SHORT TERM GOAL #1   Title Pt to be independent with initial HEP    Time 2    Period Weeks    Status New    Target Date 06/23/20             PT Long Term Goals -  06/21/20 0822      PT LONG TERM GOAL #1   Title Pt to be independent wtih final HEP    Time 6    Period Weeks    Status New    Target Date 07/21/20      PT LONG TERM GOAL #2   Title Pt to demo strength of L ankle to be at least 4+/5 to improve stability and gait    Time 6    Period Weeks    Status New    Target Date 07/21/20      PT LONG TERM GOAL #3   Title Pt to report decreased pain in ankle to 0-/10 with standing, walking, and dynamic activity, for ability to return to work.    Time 6    Period Weeks    Status New    Target Date 07/21/20      PT LONG TERM GOAL #4   Title Pt to demo stability of L ankle to be WNL for single leg and dynamic movements for safe return to work.    Time 6    Period Weeks    Status New    Target Date 07/21/20                 Plan - 06/21/20 1962    Clinical Impression Statement Pt with improved ability for activity, ROM, and strengthening today, with very little pain with activity. Plan to progress as able with strength and stabilization    Examination-Activity Limitations Locomotion Level;Carry;Squat;Stairs;Stand;Lift    Examination-Participation Restrictions Occupation;Cleaning;Community Activity;Driving;Meal  Prep    Stability/Clinical Decision Making Stable/Uncomplicated    Rehab Potential Good    PT Frequency 2x / week    PT Duration 6 weeks    PT Treatment/Interventions ADLs/Self Care Home Management;Canalith Repostioning;Cryotherapy;Psychologist, educational;Iontophoresis 4mg /ml Dexamethasone;Moist Heat;Traction;Ultrasound;Balance training;Therapeutic exercise;Therapeutic activities;Functional mobility training;Stair training;Gait training;Cognitive remediation;Patient/family education;Orthotic Fit/Training;Passive range of motion;Manual techniques;Dry needling;Splinting;Taping;Vasopneumatic Device;Spinal Manipulations;Joint Manipulations    PT Home Exercise Plan [email or web address]    Consulted and Agree with Plan of Care Patient           Patient will benefit from skilled therapeutic intervention in order to improve the following deficits and impairments:  Abnormal gait,Decreased range of motion,Increased muscle spasms,Decreased activity tolerance,Pain,Decreased balance,Impaired flexibility,Decreased strength,Decreased mobility,Increased edema  Visit Diagnosis: Pain in left ankle and joints of left foot     Problem List Patient Active Problem List   Diagnosis Date Noted  . High ankle sprain, left, initial encounter 05/29/2020  . Chiari I malformation (HCC) 2016    2017, PT, DPT 8:55 AM  06/21/20    [facility] [place] [facility] [address] [place], [place], [place] Phone: [phone]   Fax:  [phone]  Name: Taurus Alamo MRN: Raford Pitcher Date of Birth: 1996-03-08

## 2020-06-22 ENCOUNTER — Other Ambulatory Visit: Payer: Self-pay

## 2020-06-23 ENCOUNTER — Other Ambulatory Visit (HOSPITAL_COMMUNITY)
Admission: RE | Admit: 2020-06-23 | Discharge: 2020-06-23 | Disposition: A | Discharge status: Home / Self Care | Payer: BLUE CROSS/BLUE SHIELD | Source: Ambulatory Visit | Attending: Family Medicine | Admitting: Family Medicine

## 2020-06-23 ENCOUNTER — Other Ambulatory Visit (INDEPENDENT_AMBULATORY_CARE_PROVIDER_SITE_OTHER): Payer: No Typology Code available for payment source

## 2020-06-23 ENCOUNTER — Telehealth: Payer: Self-pay

## 2020-06-23 ENCOUNTER — Encounter: Payer: Self-pay | Admitting: Physical Therapy

## 2020-06-23 ENCOUNTER — Ambulatory Visit (INDEPENDENT_AMBULATORY_CARE_PROVIDER_SITE_OTHER): Payer: Worker's Compensation | Admitting: Physical Therapy

## 2020-06-23 ENCOUNTER — Other Ambulatory Visit: Payer: Self-pay

## 2020-06-23 ENCOUNTER — Encounter: Payer: No Typology Code available for payment source | Admitting: Physical Therapy

## 2020-06-23 DIAGNOSIS — Z118 Encounter for screening for other infectious and parasitic diseases: Secondary | ICD-10-CM

## 2020-06-23 DIAGNOSIS — M25572 Pain in left ankle and joints of left foot: Secondary | ICD-10-CM | POA: Diagnosis not present

## 2020-06-23 DIAGNOSIS — Z114 Encounter for screening for human immunodeficiency virus [HIV]: Secondary | ICD-10-CM

## 2020-06-23 DIAGNOSIS — Z113 Encounter for screening for infections with a predominantly sexual mode of transmission: Secondary | ICD-10-CM

## 2020-06-23 DIAGNOSIS — R7989 Other specified abnormal findings of blood chemistry: Secondary | ICD-10-CM | POA: Diagnosis not present

## 2020-06-23 LAB — COMPREHENSIVE METABOLIC PANEL
ALT: 84 U/L — ABNORMAL HIGH (ref 0–53)
AST: 88 U/L — ABNORMAL HIGH (ref 0–37)
Albumin: 4.7 g/dL (ref 3.5–5.2)
Alkaline Phosphatase: 60 U/L (ref 39–117)
BUN: 14 mg/dL (ref 6–23)
CO2: 28 mEq/L (ref 19–32)
Calcium: 10.2 mg/dL (ref 8.4–10.5)
Chloride: 99 mEq/L (ref 96–112)
Creatinine, Ser: 1.1 mg/dL (ref 0.40–1.50)
GFR: 93.74 mL/min (ref >60.00)
Glucose, Bld: 89 mg/dL (ref 70–99)
Potassium: 3.5 mEq/L (ref 3.5–5.1)
Sodium: 135 mEq/L (ref 135–145)
Total Bilirubin: 0.7 mg/dL (ref 0.2–1.2)
Total Protein: 7.8 g/dL (ref 6.0–8.3)

## 2020-06-23 NOTE — Progress Notes (Signed)
Patient requests STD testing to be added to labs from today.  May have a new exposure per discussion with staff-labs were added in

## 2020-06-23 NOTE — Telephone Encounter (Signed)
I ordered this.  The only thing not included is warts and herpes testing-those are primarily done by visual inspection

## 2020-06-23 NOTE — Telephone Encounter (Signed)
Pt is coming in for labs at 9:30. He is also asking for a full panel std test to be added to the orders.

## 2020-06-23 NOTE — Telephone Encounter (Signed)
Noted  

## 2020-06-23 NOTE — Therapy (Signed)
[facility] [place] [facility] [address] [place], [place], [address] Phone: [phone]   Fax:  [phone]  Physical Therapy Treatment  Patient Details  Name: Jeffrey Frost MRN: [id number] Date of Birth: [birth year] Referring Provider (PT): Clementeen Graham   Encounter Date: 06/23/2020   PT End of Session - 06/23/20 1130    Visit Number 3    Number of Visits 12    Date for PT Re-Evaluation 07/21/20    Authorization Type [employer]    PT Start Time 1000    PT Stop Time 1047    PT Time Calculation (min) 47 min    Activity Tolerance Patient tolerated treatment well    Behavior During Therapy [facility] for tasks assessed/performed           Past Medical History:  Diagnosis Date  . Asthma    as a child   . Chiari I malformation (HCC) 2016   s/p decompression surgery with Dr. Thad Ranger In [place]. still does appointments there.     Past Surgical History:  Procedure Laterality Date  . ADENOIDECTOMY    . Chiari decompression  11/24/2015  . TONSILLECTOMY      There were no vitals filed for this visit.   Subjective Assessment - 06/23/20 1024    Subjective Pt reports doing well. Minimal pain with HEP but notes feelings of instability with walking w/o CAM. Pt reports sig improvement.    Currently in Pain? Yes    Pain Score 1     Pain Location Ankle    Pain Orientation Left    Pain Descriptors / Indicators Aching    Pain Type Acute pain    Pain Onset 1 to 4 weeks ago    Pain Frequency Intermittent                             [facility] Adult PT Treatment/Exercise - 06/23/20 1035      Exercises   Exercises Ankle      Manual Therapy   Manual Therapy Joint mobilization    Manual therapy comments L post TC glide, grade III      Ankle Exercises: Stretches   Soleus Stretch 3 reps;30 seconds    Soleus Stretch Limitations standing at wall    Gastroc Stretch 2 reps;30 seconds    Gastroc Stretch Limitations standing at wall      Ankle  Exercises: Aerobic   Recumbent Bike L2 x 6 min ;      Ankle Exercises: Plyometrics   Plyometric Exercises Bosu lateral step 2x10   x10 slow, x10 quick     Ankle Exercises: Standing   SLS 30 sec x 2;  WIth head turns 2x10 bil;    Heel Raises 20 reps   heel drop at stairs   Balance Beam --    Other Standing Ankle Exercises Squats x 20;   FS 25lb Bar   Other Standing Ankle Exercises Bosu- standing weight shift, SL woodpecker 5s hold 15x   half squat, AP & ML     Ankle Exercises: Seated   Heel Raises --      Ankle Exercises: Supine   Other Supine Ankle Exercises ankle INV/EV, GTB x 20;                    PT Short Term Goals - 06/23/20 1130      PT SHORT TERM GOAL #1   Title Pt to be  independent with initial HEP    Time 2    Period Weeks    Status Achieved    Target Date 06/23/20             PT Long Term Goals - 06/21/20 0822      PT LONG TERM GOAL #1   Title Pt to be independent wtih final HEP    Time 6    Period Weeks    Status New    Target Date 07/21/20      PT LONG TERM GOAL #2   Title Pt to demo strength of L ankle to be at least 4+/5 to improve stability and gait    Time 6    Period Weeks    Status New    Target Date 07/21/20      PT LONG TERM GOAL #3   Title Pt to report decreased pain in ankle to 0-/10 with standing, walking, and dynamic activity, for ability to return to work.    Time 6    Period Weeks    Status New    Target Date 07/21/20      PT LONG TERM GOAL #4   Title Pt to demo stability of L ankle to be WNL for single leg and dynamic movements for safe return to work.    Time 6    Period Weeks    Status New    Target Date 07/21/20                 Plan - 06/23/20 1132    Clinical Impression Statement Pt progressing well. Able to progress ther ex without pain during session today. Still has mild swelling at lateral ankle as well as tendernesss with palpation. Notes instability feeling in ankle, will continue with  strength/stabilization exercises to improve.    Examination-Activity Limitations Locomotion Level;Carry;Squat;Stairs;Stand;Lift    Examination-Participation Restrictions Occupation;Cleaning;Community Activity;Driving;Meal Prep    Stability/Clinical Decision Making Stable/Uncomplicated    Rehab Potential Good    PT Frequency 2x / week    PT Duration 6 weeks    PT Treatment/Interventions ADLs/Self Care Home Management;Canalith Repostioning;Cryotherapy;Psychologist, educational;Iontophoresis 4mg /ml Dexamethasone;Moist Heat;Traction;Ultrasound;Balance training;Therapeutic exercise;Therapeutic activities;Functional mobility training;Stair training;Gait training;Cognitive remediation;Patient/family education;Orthotic Fit/Training;Passive range of motion;Manual techniques;Dry needling;Splinting;Taping;Vasopneumatic Device;Spinal Manipulations;Joint Manipulations    PT Home Exercise Plan [email or web address]    Consulted and Agree with Plan of Care Patient           Patient will benefit from skilled therapeutic intervention in order to improve the following deficits and impairments:  Abnormal gait,Decreased range of motion,Increased muscle spasms,Decreased activity tolerance,Pain,Decreased balance,Impaired flexibility,Decreased strength,Decreased mobility,Increased edema  Visit Diagnosis: Pain in left ankle and joints of left foot     Problem List Patient Active Problem List   Diagnosis Date Noted  . High ankle sprain, left, initial encounter 05/29/2020  . Chiari I malformation (HCC) 2016    2017, PT, DPT 11:34 AM  06/23/20    [facility] [place] [facility] [address] [place], [place], [place] Phone: [phone]   Fax:  [phone]  Name: Jeffrey Frost MRN: Raford Pitcher Date of Birth: 1995/06/15

## 2020-06-23 NOTE — Telephone Encounter (Signed)
Is this okay?

## 2020-06-24 ENCOUNTER — Ambulatory Visit (INDEPENDENT_AMBULATORY_CARE_PROVIDER_SITE_OTHER): Payer: Worker's Compensation | Admitting: Family Medicine

## 2020-06-24 VITALS — BP 102/78 | HR 90 | Ht 72.0 in | Wt 184.0 lb

## 2020-06-24 DIAGNOSIS — S93492D Sprain of other ligament of left ankle, subsequent encounter: Secondary | ICD-10-CM | POA: Diagnosis not present

## 2020-06-24 DIAGNOSIS — M25572 Pain in left ankle and joints of left foot: Secondary | ICD-10-CM

## 2020-06-24 LAB — URINE CYTOLOGY ANCILLARY ONLY
Chlamydia: NEGATIVE
Comment: NEGATIVE
Comment: NEGATIVE
Comment: NORMAL
Neisseria Gonorrhea: NEGATIVE
Trichomonas: NEGATIVE

## 2020-06-24 LAB — RPR: RPR Ser Ql: NONREACTIVE

## 2020-06-24 LAB — HIV ANTIBODY (ROUTINE TESTING W REFLEX): HIV 1&2 Ab, 4th Generation: NONREACTIVE

## 2020-06-24 NOTE — Patient Instructions (Addendum)
Thank you for coming in today.  Please go to [facility] supply to get the ASO ankle brace we talked about today. You may also be able to get it from [employer].   Recheck in 2 weeks.   Continue PT and home exercises.   Let me know if this is not going ok.

## 2020-06-24 NOTE — Progress Notes (Signed)
   I, Philbert Riser, LAT, ATC acting as a scribe for Jeffrey Graham, MD.  Copelan Maultsby is a 25 y.o. male who presents to [facility] at [facility] today for f/u L syndesmotic ankle sprain after he rolled his L ankle into inversion as he was stepping down from a ladder on 05/28/20. Pt was last seen by Dr. Denyse Amass on 06/05/20 and advised to continue wearing CAM walker boot and was referred to PT of which he's completed 3 visits. Today, pt reports reports 80% improvement in ankle pain. Pt reports he is having pain around L lateral malleolus and swelling is present. Pt notes improvement in ankle stability.   Dx imaging: 06/05/20 L ankle XR & L foot XR  05/29/20 L ankle XR  Pertinent review of systems: No fevers or chills  Relevant historical information: History of Chiari I malformation status post surgery.  Works as a Data processing manager.   Exam:  BP 102/78 (BP Location: Right Arm, Patient Position: Sitting, Cuff Size: Normal)   Pulse 90   Ht 6' (1.829 m)   Wt 184 lb (83.5 kg)   SpO2 97%   BMI 24.95 kg/m  General: Well Developed, well nourished, and in no acute distress.   MSK: Left ankle still swollen at anterior lateral ankle.  Tender palpation ATFL region.  Laxity to talar tilt and anterior drawer test. Strength intact to ankle motion. Pulses capillary fill and sensation are intact distally.    Lab and Radiology Results  EXAM: LEFT ANKLE COMPLETE - 3+ VIEW  COMPARISON:  None.  FINDINGS: There is no evidence of fracture, dislocation, or joint effusion. There is no evidence of arthropathy or other focal bone abnormality. Soft tissues are unremarkable.  IMPRESSION: Negative.   Electronically Signed   By: Jasmine Pang M.D.   On: 06/05/2020 15:51  I, Jeffrey Frost, personally (independently) visualized and performed the interpretation of the images attached in this note.    Assessment and Plan: 25 y.o. male with left ankle pain following a high ankle  sprain occurring January 13.  Patient is now approximately 3 weeks out from injury.  He is doing pretty well already out of the Cam walker boot transitioning out to ASO ankle brace as directed by myself and with physical therapy recommendations.  He still has some laxity on physical exam as well as pain and swelling.  Unfortunately his job as a Data processing manager does require climbing ladders and some heavy duty activity which is not compatible with the degree of instability that his ankle has currently.  Plan to transition to ASO ankle brace and continued home exercise and PT.  Recheck in 2 weeks and will redetermine return to work activity at that time.  Consider MRI at 6-week mark if not improved.  This is a workers comp injury.    Discussed warning signs or symptoms. Please see discharge instructions. Patient expresses understanding.   The above documentation has been reviewed and is accurate and complete Jeffrey Frost, M.D.

## 2020-06-25 ENCOUNTER — Encounter: Payer: Self-pay | Admitting: Physical Therapy

## 2020-06-25 ENCOUNTER — Ambulatory Visit (INDEPENDENT_AMBULATORY_CARE_PROVIDER_SITE_OTHER): Payer: Worker's Compensation | Admitting: Physical Therapy

## 2020-06-25 ENCOUNTER — Telehealth: Payer: Self-pay

## 2020-06-25 ENCOUNTER — Other Ambulatory Visit: Payer: Self-pay

## 2020-06-25 ENCOUNTER — Ambulatory Visit: Payer: No Typology Code available for payment source | Admitting: Family Medicine

## 2020-06-25 DIAGNOSIS — M25572 Pain in left ankle and joints of left foot: Secondary | ICD-10-CM | POA: Diagnosis not present

## 2020-06-25 DIAGNOSIS — R7989 Other specified abnormal findings of blood chemistry: Secondary | ICD-10-CM

## 2020-06-25 NOTE — Therapy (Signed)
[facility] [address] [place], [place], [address] Phone: [phone]   Fax:  [phone]  Physical Therapy Treatment  Patient Details  Name: Jeffrey Frost MRN: [id number] Date of Birth: 1995-05-23 Referring Provider (PT): Clementeen Graham   Encounter Date: 06/25/2020   PT End of Session - 06/25/20 0915    Visit Number 4    Number of Visits 12    Date for PT Re-Evaluation 07/21/20    Authorization Type [employer]    PT Start Time 0848    PT Stop Time 0930    PT Time Calculation (min) 42 min    Activity Tolerance Patient tolerated treatment well    Behavior During Therapy [facility] for tasks assessed/performed           Past Medical History:  Diagnosis Date  . Asthma    as a child   . Chiari I malformation (HCC) 2016   s/p decompression surgery with Dr. Thad Ranger [place]. still does appointments there.     Past Surgical History:  Procedure Laterality Date  . ADENOIDECTOMY    . Chiari decompression  11/24/2015  . TONSILLECTOMY      There were no vitals filed for this visit.   Subjective Assessment - 06/25/20 0913    Subjective Pt had MD visit, will be out of work another 2 weeks. DId suggest fig 8 brace, pt will obtain. Pt with mild soreness in lateral ankle, mild swelling, andmild soreness after last visit. Doing well with exercises.    Currently in Pain? Yes    Pain Score 2     Pain Location Ankle    Pain Orientation Left    Pain Descriptors / Indicators Aching    Pain Type Acute pain    Pain Onset 1 to 4 weeks ago    Pain Frequency Intermittent                                     PT Education - 06/25/20 1220    Education Details Reviewed HEP, reviewed activities to avoid at this time.    Person(s) Educated Patient    Methods Explanation;Demonstration;Tactile cues;Verbal cues    Comprehension Verbalized understanding;Returned demonstration;Verbal cues required;Tactile cues required             PT Short Term Goals - 06/23/20 1130      PT SHORT TERM GOAL #1   Title Pt to be independent with initial HEP    Time 2    Period Weeks    Status Achieved    Target Date 06/23/20             PT Long Term Goals - 06/21/20 0822      PT LONG TERM GOAL #1   Title Pt to be independent wtih final HEP    Time 6    Period Weeks    Status New    Target Date 07/21/20      PT LONG TERM GOAL #2   Title Pt to demo strength of L ankle to be at least 4+/5 to improve stability and gait    Time 6    Period Weeks    Status New    Target Date 07/21/20      PT LONG TERM GOAL #3   Title Pt to report decreased pain in ankle to 0-/10 with standing, walking, and dynamic activity, for ability to return  to work.    Time 6    Period Weeks    Status New    Target Date 07/21/20      PT LONG TERM GOAL #4   Title Pt to demo stability of L ankle to be WNL for single leg and dynamic movements for safe return to work.    Time 6    Period Weeks    Status New    Target Date 07/21/20                 Plan - 06/25/20 1223    Clinical Impression Statement Pt with good ability for ther ex, but does have soreness at lateral ankle with palpation, with mild swelling. Discussed continuing to not over do activity, as well as icing as needed. Ionto patch given at lateral ankle for pain/inflammation today. Progressed strength and stability. Pt to benefit from continued care. WIll likely transition from boot to fig 8 brace.    Examination-Activity Limitations Locomotion Level;Carry;Squat;Stairs;Stand;Lift    Examination-Participation Restrictions Occupation;Cleaning;Community Activity;Driving;Meal Prep    Stability/Clinical Decision Making Stable/Uncomplicated    Rehab Potential Good    PT Frequency 2x / week    PT Duration 6 weeks    PT Treatment/Interventions ADLs/Self Care Home Management;Canalith Repostioning;Cryotherapy;Psychologist, educational;Iontophoresis 4mg /ml  Dexamethasone;Moist Heat;Traction;Ultrasound;Balance training;Therapeutic exercise;Therapeutic activities;Functional mobility training;Stair training;Gait training;Cognitive remediation;Patient/family education;Orthotic Fit/Training;Passive range of motion;Manual techniques;Dry needling;Splinting;Taping;Vasopneumatic Device;Spinal Manipulations;Joint Manipulations    PT Home Exercise Plan [id number]    Consulted and Agree with Plan of Care Patient           Patient will benefit from skilled therapeutic intervention in order to improve the following deficits and impairments:  Abnormal gait,Decreased range of motion,Increased muscle spasms,Decreased activity tolerance,Pain,Decreased balance,Impaired flexibility,Decreased strength,Decreased mobility,Increased edema  Visit Diagnosis: Pain in left ankle and joints of left foot     Problem List Patient Active Problem List   Diagnosis Date Noted  . High ankle sprain, left, initial encounter 05/29/2020  . Chiari I malformation (HCC) 2016   2017, PT, DPT 12:25 PM  06/25/20    [facility] [address] [place], [place], [place] Phone: [phone]   Fax:  [phone]  Name: Jeffrey Frost MRN: Raford Pitcher Date of Birth: [birth year]

## 2020-06-25 NOTE — Telephone Encounter (Signed)
I have left patient a vm to call back.    I need a copy of his workers comp that is authorizing him to see Sedalia Muta.    We need to file prevous visits to [facility] and his upcoming.  But need the authorization from [facility] to see Lauren.

## 2020-06-26 ENCOUNTER — Ambulatory Visit: Payer: No Typology Code available for payment source | Admitting: Family Medicine

## 2020-06-30 ENCOUNTER — Encounter: Payer: No Typology Code available for payment source | Admitting: Physical Therapy

## 2020-07-01 ENCOUNTER — Encounter: Payer: No Typology Code available for payment source | Admitting: Physical Therapy

## 2020-07-03 ENCOUNTER — Ambulatory Visit: Payer: No Typology Code available for payment source | Admitting: Physical Therapy

## 2020-07-03 ENCOUNTER — Encounter: Payer: Self-pay | Admitting: Physical Therapy

## 2020-07-03 ENCOUNTER — Other Ambulatory Visit: Payer: Self-pay

## 2020-07-03 DIAGNOSIS — M25572 Pain in left ankle and joints of left foot: Secondary | ICD-10-CM | POA: Diagnosis not present

## 2020-07-03 NOTE — Therapy (Signed)
[facility] [address] [place], [place], [address] Phone: [phone]   Fax:  [phone]  Physical Therapy Treatment  Patient Details  Name: Jeffrey Frost MRN: [id number] Date of Birth: [birth year] Referring Provider (PT): Clementeen Graham   Encounter Date: 07/03/2020   PT End of Session - 07/03/20 1038    Visit Number 5    Number of Visits 12    Date for PT Re-Evaluation 07/21/20    Authorization Type [employer]    PT Start Time 1028    PT Stop Time 1120    PT Time Calculation (min) 52 min    Activity Tolerance Patient tolerated treatment well    Behavior During Therapy [facility] for tasks assessed/performed           Past Medical History:  Diagnosis Date  . Asthma    as a child   . Chiari I malformation (HCC) 2016   s/p decompression surgery with Dr. Thad Ranger In [place]. still does appointments there.     Past Surgical History:  Procedure Laterality Date  . ADENOIDECTOMY    . Chiari decompression  11/24/2015  . TONSILLECTOMY      There were no vitals filed for this visit.   Subjective Assessment - 07/03/20 1031    Subjective Pt states he has been getting more tingling in his big toes. He states they will stay numb for about an hour. He states he is calling his doctors in [place] to schedule an MRI. Pt states the ankle is doing better. He is wearing his brace outside of clinic/PT. He states he has had some Achilles area pain and lateral ankle pain.    Pertinent History 2017 brain surgery for chiari malformation    Limitations Lifting;Standing;Walking;House hold activities    Patient Stated Goals Decreased pain, return to work    Currently in Pain? Yes    Pain Score 4     Pain Location Ankle    Pain Orientation Left;Posterior;Lateral    Pain Descriptors / Indicators Aching;Sore    Pain Type Acute pain    Pain Onset 1 to 4 weeks ago    Pain Frequency Intermittent    Aggravating Factors  standing, walking, leg workout days                              [facility] Adult PT Treatment/Exercise - 07/03/20 1028      Therapeutic Activites    Therapeutic Activities Lifting    Lifting Front squatting 45 lbs 2x10      Exercises   Exercises Ankle      Manual Therapy   Manual Therapy Joint mobilization    Manual therapy comments L post TC glide, grade III      Ankle Exercises: Stretches   Soleus Stretch 3 reps;30 seconds    Soleus Stretch Limitations standing at wall    Gastroc Stretch 2 reps;30 seconds    Gastroc Stretch Limitations standing at wall      Ankle Exercises: Aerobic   Recumbent Bike L2 x 6 min ;      Ankle Exercises: Plyometrics   Plyometric Exercises Runner's step up 2nd stair with airex 2x10      Ankle Exercises: Standing   SLS --    Heel Raises 20 reps   heel drop at stair   Toe Walk (Round Trip) --    Other Standing Ankle Exercises Bosu squats 2x10    Other  Standing Ankle Exercises Bosu- standing weight shift, SL woodpecker 5s hold 15x, SLS on foam with weight pass 3x33min barbell   half squat, AP & ML     Ankle Exercises: Seated   BAPS --    Other Seated Ankle Exercises self banded post TC mob, blue band 3 min      Ankle Exercises: Supine   Other Supine Ankle Exercises --                  PT Education - 07/03/20 1036    Education Details Reviewed HEP, reviewed activities to avoid at this time, avoiding running/jumping    Person(s) Educated Patient    Methods Explanation;Demonstration;Verbal cues    Comprehension Verbalized understanding;Returned demonstration;Verbal cues required;Tactile cues required            PT Short Term Goals - 06/23/20 1130      PT SHORT TERM GOAL #1   Title Pt to be independent with initial HEP    Time 2    Period Weeks    Status Achieved    Target Date 06/23/20             PT Long Term Goals - 06/21/20 0822      PT LONG TERM GOAL #1   Title Pt to be independent wtih final HEP    Time 6    Period Weeks    Status  New    Target Date 07/21/20      PT LONG TERM GOAL #2   Title Pt to demo strength of L ankle to be at least 4+/5 to improve stability and gait    Time 6    Period Weeks    Status New    Target Date 07/21/20      PT LONG TERM GOAL #3   Title Pt to report decreased pain in ankle to 0-/10 with standing, walking, and dynamic activity, for ability to return to work.    Time 6    Period Weeks    Status New    Target Date 07/21/20      PT LONG TERM GOAL #4   Title Pt to demo stability of L ankle to be WNL for single leg and dynamic movements for safe return to work.    Time 6    Period Weeks    Status New    Target Date 07/21/20                 Plan - 07/03/20 1039    Clinical Impression Statement Pt demonstrates ability to progress functional balance loading exercise at today's sesison. Pt has increased difficuly with medial-lateral stability after shifting COG outside of BOS. Pt's TC joint was limited in posterior glide with anterior impingement during front squatting and stretching. Was relieved with manual therapy and self banded mobilization. Pt is likely able to progress to more dynamic CKC and single leg loaded exercise on pliant surface at next session. Pt would benefit from continued skilled therapy in order to maximize functional L ankle strength and stability for prevention of future sprain and return PLOF/occupation.    Examination-Activity Limitations Locomotion Level;Carry;Squat;Stairs;Stand;Lift    Examination-Participation Restrictions Occupation;Cleaning;Community Activity;Driving;Meal Prep    Stability/Clinical Decision Making Stable/Uncomplicated    Clinical Decision Making Low    Rehab Potential Good    PT Frequency 2x / week    PT Duration 6 weeks    PT Treatment/Interventions ADLs/Self Care Home Management;Canalith Repostioning;Cryotherapy;Psychologist, educational;Iontophoresis 4mg /ml Dexamethasone;Moist Heat;Traction;Ultrasound;Balance  training;Therapeutic  exercise;Therapeutic activities;Functional mobility training;Stair training;Gait training;Cognitive remediation;Patient/family education;Orthotic Fit/Training;Passive range of motion;Manual techniques;Dry needling;Splinting;Taping;Vasopneumatic Device;Spinal Manipulations;Joint Manipulations    Consulted and Agree with Plan of Care Patient           Patient will benefit from skilled therapeutic intervention in order to improve the following deficits and impairments:  Abnormal gait,Decreased range of motion,Increased muscle spasms,Decreased activity tolerance,Pain,Decreased balance,Impaired flexibility,Decreased strength,Decreased mobility,Increased edema  Visit Diagnosis: Pain in left ankle and joints of left foot     Problem List Patient Active Problem List   Diagnosis Date Noted  . High ankle sprain, left, initial encounter 05/29/2020  . Chiari I malformation [facility]) 2016    Zebedee Iba PT, DPT 07/03/20 11:40 AM   [facility] [address] [place], [place], [address] Phone: [phone]   Fax:  [phone]  Name: Breion Novacek MRN: [id number] Date of Birth: 1996/01/23

## 2020-07-03 NOTE — Patient Instructions (Signed)
Self banded ankle mob- posterior TC glide 3 min (over pinky toe, middle, and big toe) Standing balance on foam with weight pass 3x20min Woodpecker 3s hold 15x

## 2020-07-07 ENCOUNTER — Telehealth: Payer: Self-pay

## 2020-07-07 NOTE — Telephone Encounter (Signed)
Patient called in stating that he did a follow up appointment with his Neurologist from [place], and usually he has an MRI cervical spine and head from when he had decompression surgery back in 2017. He has reported to them that he has been noticing numbness in his feet, to which his neurologist in [place] wants to push for a MRI before Spring.

## 2020-07-07 NOTE — Telephone Encounter (Signed)
I suspect he can order down here- may want to give him contact for [facility] imaging- he will need to chat with insurance to make sure they cover that location

## 2020-07-07 NOTE — Telephone Encounter (Signed)
Called and spoke with pt and he states he can have his nuerosurgeon order them and have the MRI done down here but is wanting the address of where the MRI will take place so that his insurance will cover.  Pt is unsure of the best way to go about this, is this doable?

## 2020-07-07 NOTE — Telephone Encounter (Signed)
I cant quite tell from note- are they asking [place] to order these or is he going to do this up there and just an FYI for [place]?

## 2020-07-07 NOTE — Telephone Encounter (Signed)
See below

## 2020-07-07 NOTE — Progress Notes (Signed)
   I, Christoper Fabian, LAT, ATC, am serving as scribe for Dr. Clementeen Graham.  Jeffrey Frost is a 25 y.o. male who presents to [facility] at [facility] today for f/u of L ankle pain due to a syndesmotic sprain that he suffered on 05/28/20 when he rolled his ankle while stepping down from a ladder.  He was last seen by Dr. Denyse Amass on 06/24/20 and noted approximately 80% improvement in his symptoms.  He has completed 5 PT sessions.  He was advised to transition to an ASO ankle brace and to con't PT.  Since his last visit, pt reports, improvement in ankle pain. Pt reports slight inflammation. Pt notes slight pain/pinch w/ ankle PF and over talocrural joint. Pt has been compliant w/ wearing ASO ankle brace and PT.      Diagnostic imaging: L ankle XR- 06/05/20, 05/29/20; L foot XR- 06/05/20  Pertinent review of systems: No fevers or chills. Some numbness and tingling both feet thought to be unrelated to his ankle sprain.    Relevant historical information: Chiari malformation status post surgery.  Neurosurgeon currently in process of repeating MRI brain.  Unrelated to a Worker's Comp. injury  Exam:  BP 111/63 (BP Location: Right Arm, Patient Position: Sitting, Cuff Size: Normal)   Pulse 71   Ht 6' (1.829 m)   Wt 189 lb 3.2 oz (85.8 kg)   SpO2 98%   BMI 25.66 kg/m  General: Well Developed, well nourished, and in no acute distress.   MSK: Left ankle normal-appearing Normal motion. Palpable collection with talar tilt testing.  Positive talar tilt and anterior drawer test. Intact strength. Pulses cap refill and sensation are intact distally.     Assessment and Plan: 25 y.o. male with left ankle sprain.  Significant syndesmosis ankle sprain.  Ankle sprain is now approximately 53 weeks old.  Patient is clinically significantly improving but not fully better to return to work without restrictions.  His job requires climbing ladders and lifting heavy loads which is ankle is not safe for at this  time.  Plan to recheck in 2 weeks.  At that time I anticipate he will probably be able to return without restrictions.  Continue PT and home exercise program.    Discussed warning signs or symptoms. Please see discharge instructions. Patient expresses understanding.   The above documentation has been reviewed and is accurate and complete Clementeen Graham, M.D.

## 2020-07-08 ENCOUNTER — Other Ambulatory Visit: Payer: Self-pay

## 2020-07-08 ENCOUNTER — Ambulatory Visit: Payer: No Typology Code available for payment source | Admitting: Physical Therapy

## 2020-07-08 ENCOUNTER — Encounter: Payer: Self-pay | Admitting: Physical Therapy

## 2020-07-08 ENCOUNTER — Ambulatory Visit (INDEPENDENT_AMBULATORY_CARE_PROVIDER_SITE_OTHER): Payer: No Typology Code available for payment source | Admitting: Family Medicine

## 2020-07-08 VITALS — BP 111/63 | HR 71 | Ht 72.0 in | Wt 189.2 lb

## 2020-07-08 DIAGNOSIS — M25572 Pain in left ankle and joints of left foot: Secondary | ICD-10-CM

## 2020-07-08 DIAGNOSIS — S93492D Sprain of other ligament of left ankle, subsequent encounter: Secondary | ICD-10-CM | POA: Diagnosis not present

## 2020-07-08 NOTE — Patient Instructions (Addendum)
Thank you for coming in today.   The info for MRI in this region is  [facility] at [facility] [phone]  [address]   Lets give this 2 more weeks. (march 10th)  Anticipate return to work then.   Continue home exercises and PT.

## 2020-07-08 NOTE — Therapy (Signed)
[facility] [address] [place], [place], [address] Phone: [phone]   Fax:  [phone]  Physical Therapy Treatment  Patient Details  Name: Jeffrey Frost MRN: [id number] Date of Birth: [birth year] Referring Provider (PT): Clementeen Graham   Encounter Date: 07/08/2020   PT End of Session - 07/08/20 1110    Visit Number 6    Number of Visits 12    Date for PT Re-Evaluation 07/21/20    Authorization Type [employer]    PT Start Time 1013    PT Stop Time 1055    PT Time Calculation (min) 42 min    Activity Tolerance Patient tolerated treatment well    Behavior During Therapy [facility] for tasks assessed/performed           Past Medical History:  Diagnosis Date  . Asthma    as a child   . Chiari I malformation (HCC) 2016   s/p decompression surgery with Dr. Thad Ranger [place]. still does appointments there.     Past Surgical History:  Procedure Laterality Date  . ADENOIDECTOMY    . Chiari decompression  11/24/2015  . TONSILLECTOMY      There were no vitals filed for this visit.   Subjective Assessment - 07/08/20 1107    Subjective Pt states doing well, has been able to progress activity and strengthening. He reports soreness at anterior/lateral ankle with increased DF, stairs, terminal stance. Has been wearing fig 8 brace.    Pertinent History 2017 brain surgery for chiari malformation    Limitations Lifting;Standing;Walking;House hold activities    Patient Stated Goals Decreased pain, return to work    Currently in Pain? Yes    Pain Score 2     Pain Location Ankle    Pain Orientation Left    Pain Descriptors / Indicators Aching    Pain Type Acute pain    Pain Onset 1 to 4 weeks ago    Pain Frequency Intermittent                             [facility] Adult PT Treatment/Exercise - 07/08/20 0001      Therapeutic Activites    Therapeutic Activities Lifting    Lifting Front squatting 45 lbs 2x10       Exercises   Exercises Ankle      Manual Therapy   Manual Therapy Joint mobilization    Joint Mobilization L post TC glide, grade III      Ankle Exercises: Aerobic   Tread Mill 2.5 mph walk, grade 3 incline, x 5 min      Ankle Exercises: Plyometrics   Plyometric Exercises Runner's step up 2nd stair x10 bil,  with airex  x10 bil      Ankle Exercises: Standing   Heel Raises 20 reps   heel drop at stair   Other Standing Ankle Exercises Bosu squats 2x10; Lateral hops x 20 quick; Lunges onto BOSU x15 bil;    Other Standing Ankle Exercises SLS on foam with weight diagonal reach x 10 bi;  SLS on foam with SL RDL 10 lb; Walk/ March 10 ft x 4 fwd and bwd   half squat, AP & ML     Ankle Exercises: Seated   Other Seated Ankle Exercises self banded post TC mob, blue band 3 min    Other Seated Ankle Exercises GTB strenth ankld 4 way x 25;  PT Short Term Goals - 06/23/20 1130      PT SHORT TERM GOAL #1   Title Pt to be independent with initial HEP    Time 2    Period Weeks    Status Achieved    Target Date 06/23/20             PT Long Term Goals - 06/21/20 0822      PT LONG TERM GOAL #1   Title Pt to be independent wtih final HEP    Time 6    Period Weeks    Status New    Target Date 07/21/20      PT LONG TERM GOAL #2   Title Pt to demo strength of L ankle to be at least 4+/5 to improve stability and gait    Time 6    Period Weeks    Status New    Target Date 07/21/20      PT LONG TERM GOAL #3   Title Pt to report decreased pain in ankle to 0-/10 with standing, walking, and dynamic activity, for ability to return to work.    Time 6    Period Weeks    Status New    Target Date 07/21/20      PT LONG TERM GOAL #4   Title Pt to demo stability of L ankle to be WNL for single leg and dynamic movements for safe return to work.    Time 6    Period Weeks    Status New    Target Date 07/21/20                 Plan - 07/08/20 1112     Clinical Impression Statement Pt progressing well with pain and with ability for strength progression. Moderate instability noted with unstable surface stability work today. Pt with soreness at ant/lat ankle wiht increased DF, lunges, terminal stance, etc. Reviewed HEP and exercise progression today. Pt to benefit from continued  care.    Examination-Activity Limitations Locomotion Level;Carry;Squat;Stairs;Stand;Lift    Examination-Participation Restrictions Occupation;Cleaning;Community Activity;Driving;Meal Prep    Stability/Clinical Decision Making Stable/Uncomplicated    Rehab Potential Good    PT Frequency 2x / week    PT Duration 6 weeks    PT Treatment/Interventions ADLs/Self Care Home Management;Canalith Repostioning;Cryotherapy;Psychologist, educational;Iontophoresis 4mg /ml Dexamethasone;Moist Heat;Traction;Ultrasound;Balance training;Therapeutic exercise;Therapeutic activities;Functional mobility training;Stair training;Gait training;Cognitive remediation;Patient/family education;Orthotic Fit/Training;Passive range of motion;Manual techniques;Dry needling;Splinting;Taping;Vasopneumatic Device;Spinal Manipulations;Joint Manipulations    Consulted and Agree with Plan of Care Patient           Patient will benefit from skilled therapeutic intervention in order to improve the following deficits and impairments:  Abnormal gait,Decreased range of motion,Increased muscle spasms,Decreased activity tolerance,Pain,Decreased balance,Impaired flexibility,Decreased strength,Decreased mobility,Increased edema  Visit Diagnosis: Pain in left ankle and joints of left foot     Problem List Patient Active Problem List   Diagnosis Date Noted  . High ankle sprain, left, initial encounter 05/29/2020  . Chiari I malformation (HCC) 2016    2017, PT, DPT 12:04 PM  07/08/20    [facility] [address] [place], [place],  [place] Phone: [phone]   Fax:  [phone]  Name: Jeffrey Frost MRN: Raford Pitcher Date of Birth: 09-12-1995

## 2020-07-09 ENCOUNTER — Telehealth: Payer: Self-pay

## 2020-07-09 NOTE — Telephone Encounter (Signed)
Please schedule pt for OV regarding this.

## 2020-07-09 NOTE — Telephone Encounter (Signed)
Called and lm on pt vm tcb. 

## 2020-07-09 NOTE — Telephone Encounter (Signed)
Pt is requesting orders be put in for an STD test. He says its mainly for chlamydia, that he has had a scare.

## 2020-07-09 NOTE — Telephone Encounter (Signed)
Pt is scheduled °

## 2020-07-10 ENCOUNTER — Other Ambulatory Visit: Payer: Self-pay

## 2020-07-10 ENCOUNTER — Ambulatory Visit: Payer: No Typology Code available for payment source | Admitting: Physician Assistant

## 2020-07-10 ENCOUNTER — Encounter: Payer: Self-pay | Admitting: Physical Therapy

## 2020-07-10 ENCOUNTER — Ambulatory Visit: Payer: No Typology Code available for payment source | Admitting: Physical Therapy

## 2020-07-10 ENCOUNTER — Encounter: Payer: Self-pay | Admitting: Physician Assistant

## 2020-07-10 VITALS — BP 113/68 | HR 82 | Temp 98.4°F | Ht 72.0 in | Wt 193.2 lb

## 2020-07-10 DIAGNOSIS — Z118 Encounter for screening for other infectious and parasitic diseases: Secondary | ICD-10-CM | POA: Diagnosis not present

## 2020-07-10 DIAGNOSIS — M25572 Pain in left ankle and joints of left foot: Secondary | ICD-10-CM | POA: Diagnosis not present

## 2020-07-10 DIAGNOSIS — Z202 Contact with and (suspected) exposure to infections with a predominantly sexual mode of transmission: Secondary | ICD-10-CM | POA: Diagnosis not present

## 2020-07-10 DIAGNOSIS — Z113 Encounter for screening for infections with a predominantly sexual mode of transmission: Secondary | ICD-10-CM | POA: Diagnosis not present

## 2020-07-10 MED ORDER — AZITHROMYCIN 500 MG PO TABS
1000.0000 mg | ORAL_TABLET | Freq: Every day | ORAL | 0 refills | Status: AC
Start: 2020-07-10 — End: 2020-07-11

## 2020-07-10 MED ORDER — AZITHROMYCIN 500 MG PO TABS: 1000.0000 mg | ORAL_TABLET | Freq: Once | ORAL | 0 refills | Status: AC

## 2020-07-10 MED ORDER — CEFTRIAXONE SODIUM 1 G IJ SOLR
1.0000 g | Freq: Once | INTRAMUSCULAR | Status: AC
  Administered 2020-07-10: 1 g via INTRAMUSCULAR

## 2020-07-10 NOTE — Therapy (Signed)
[facility] [address] [place], [place], [address] Phone: [phone]   Fax:  [phone]  Physical Therapy Treatment  Patient Details  Name: Jeffrey Frost MRN: [id number] Date of Birth: 1996-01-30 Referring Provider (PT): Clementeen Graham   Encounter Date: 07/10/2020   PT End of Session - 07/10/20 1153    Visit Number 7    Number of Visits 12    Date for PT Re-Evaluation 07/21/20    Authorization Type [employer]    PT Start Time 1023    PT Stop Time 1107    PT Time Calculation (min) 44 min    Activity Tolerance Patient tolerated treatment well    Behavior During Therapy [facility] for tasks assessed/performed           Past Medical History:  Diagnosis Date  . Asthma    as a child   . Chiari I malformation (HCC) 2016   s/p decompression surgery with Dr. Thad Ranger In [place]. still does appointments there.     Past Surgical History:  Procedure Laterality Date  . ADENOIDECTOMY    . Chiari decompression  11/24/2015  . TONSILLECTOMY      There were no vitals filed for this visit.   Subjective Assessment - 07/10/20 1010    Subjective Pt states that his ankle is still a little sore in the anterior lateral and posterior aspect. He states he has been doing more at the gym recently so that may be the cause. He continues to wear the brace outside of clinic while exercising.    Pertinent History 2017 brain surgery for chiari malformation    Limitations Lifting;Standing;Walking;House hold activities    Patient Stated Goals Decreased pain, return to work    Currently in Pain? Yes    Pain Score 3     Pain Location Ankle    Pain Orientation Left    Pain Descriptors / Indicators Aching    Pain Onset 1 to 4 weeks ago    Pain Frequency Intermittent                             [facility] Adult PT Treatment/Exercise - 07/10/20 0001      Therapeutic Activites    Therapeutic Activities Lifting    Lifting Front squatting 45 lbs  2x10      Exercises   Exercises Ankle      Manual Therapy   Manual Therapy Joint mobilization;Soft tissue mobilization    Joint Mobilization L post TC glide, grade III    Soft tissue mobilization L gastroc soleus      Ankle Exercises: Aerobic   Tread Mill 2.5 mph walk, grade 3 incline, x 5 min      Ankle Exercises: Plyometrics   Plyometric Exercises Runner's step up 2nd stair x10 bil,  with airex  x10 bil      Ankle Exercises: Standing   Heel Raises 20 reps   heel drop at stair   Other Standing Ankle Exercises Bsu squats and lateral quick step 2x10; SLS on foam with ball toss 3x20; march back and lateral with manual resistance 4x, SL squat from chair 2x10    Other Standing Ankle Exercises SLS on foam with ball toss 2x20;  SLS on foam with SL RDL 10 lb; manual resisted Walk/ March 10 ft x 4 fwd and bwd   half squat, AP & ML     Ankle Exercises: Seated   Other  Seated Ankle Exercises --    Other Seated Ankle Exercises --                  PT Education - 07/10/20 1152    Education Details HEP, introduction to low level jogging with brace on level surfaces, exercise progression, avoiding pain provoking activities    Person(s) Educated Patient    Methods Explanation;Demonstration;Tactile cues    Comprehension Verbalized understanding;Returned demonstration            PT Short Term Goals - 06/23/20 1130      PT SHORT TERM GOAL #1   Title Pt to be independent with initial HEP    Time 2    Period Weeks    Status Achieved    Target Date 06/23/20             PT Long Term Goals - 06/21/20 0822      PT LONG TERM GOAL #1   Title Pt to be independent wtih final HEP    Time 6    Period Weeks    Status New    Target Date 07/21/20      PT LONG TERM GOAL #2   Title Pt to demo strength of L ankle to be at least 4+/5 to improve stability and gait    Time 6    Period Weeks    Status New    Target Date 07/21/20      PT LONG TERM GOAL #3   Title Pt to report  decreased pain in ankle to 0-/10 with standing, walking, and dynamic activity, for ability to return to work.    Time 6    Period Weeks    Status New    Target Date 07/21/20      PT LONG TERM GOAL #4   Title Pt to demo stability of L ankle to be WNL for single leg and dynamic movements for safe return to work.    Time 6    Period Weeks    Status New    Target Date 07/21/20                 Plan - 07/10/20 1154    Clinical Impression Statement Pt was able to progress today's session with more single leg resisted activity without an increase in pain. Pt does continue to demonstrate decreased L ankle proprioception and motor control with unstable surface and SL training. Pt responded well to ankle mob and gastroc soleus STM with report of decrease ankle soreness and pain. Pt likely to able to trial low level lateral shuffling at next session. Pt would benefit from continued skilled therapy in order to maximize LE function and ankle stability for prevention of future sprain and return to PLOF.    Examination-Activity Limitations Locomotion Level;Carry;Squat;Stairs;Stand;Lift    Examination-Participation Restrictions Occupation;Cleaning;Community Activity;Driving;Meal Prep    Stability/Clinical Decision Making Stable/Uncomplicated    Rehab Potential Good    PT Frequency 2x / week    PT Duration 6 weeks    PT Treatment/Interventions ADLs/Self Care Home Management;Canalith Repostioning;Cryotherapy;Psychologist, educational;Iontophoresis 4mg /ml Dexamethasone;Moist Heat;Traction;Ultrasound;Balance training;Therapeutic exercise;Therapeutic activities;Functional mobility training;Stair training;Gait training;Cognitive remediation;Patient/family education;Orthotic Fit/Training;Passive range of motion;Manual techniques;Dry needling;Splinting;Taping;Vasopneumatic Device;Spinal Manipulations;Joint Manipulations    Consulted and Agree with Plan of Care Patient           Patient will  benefit from skilled therapeutic intervention in order to improve the following deficits and impairments:  Abnormal gait,Decreased range of motion,Increased muscle spasms,Decreased activity tolerance,Pain,Decreased balance,Impaired flexibility,Decreased  strength,Decreased mobility,Increased edema  Visit Diagnosis: Pain in left ankle and joints of left foot     Problem List Patient Active Problem List   Diagnosis Date Noted  . High ankle sprain, left, initial encounter 05/29/2020  . Chiari I malformation [facility]) 2016   Zebedee Iba PT, DPT 07/10/20 12:00 PM   [facility] [address] [place], [place], [address] Phone: [phone]   Fax:  [phone]  Name: Jeffrey Frost MRN: [id number] Date of Birth: [birth year]

## 2020-07-10 NOTE — Patient Instructions (Addendum)
Rocephin 1 gram IM provided in office today. Please take the Azithromycin 500 mg 2 tablets together as well for full treatment. You will be contacted through MyChart with your lab results.  Avoid sexual intercourse for at least 7 days after treatment. Always use condoms or consider abstinence.     Chlamydia, Male Chlamydia is an STI (sexually transmitted infection) that is caused by bacteria. This infection spreads through sexual contact. Chlamydia can occur in different areas of the body, including:  The urethra. This is the part of the body that drains urine from the bladder and through the penis.  The throat.  The rectum. This condition is not difficult to treat. However, if left untreated, chlamydia can lead to more serious health problems. What are the causes? This condition is caused by the bacteria Chlamydia trachomatis. The bacteria are spread from an infected partner during sexual activity. Chlamydia can spread through contact with the genitals, mouth, or rectum. What increases the risk? The following factors may make you more likely to develop this condition:  Not using a condom correctly or not using a condom every time you have sex.  Having a new sex partner or having more than one sex partner.  Being a man who has sex with men (MSM). What are the signs or symptoms? In some cases, there are no symptoms, especially early in the infection. Having no symptoms is also called being asymptomatic. If symptoms develop, they may include:  Urinating often, or having a burning feeling during urination.  Pain or swelling in the testicles.  Watery, mucus-like discharge from the penis.  Redness, soreness, swelling, bleeding, or discharge from the rectum. This may occur if the infection was spread through anal sex.  Pain in the abdomen.  Itching, burning, or redness in the eyes, or discharge from the eyes.  Pain during sex. How is this diagnosed? This condition may be  diagnosed with:  Urine tests.  Swab tests. Depending on your symptoms, your health care provider may use a cotton swab to collect discharge from your urethra or rectum to test for the bacteria. How is this treated? This condition is treated with antibiotic medicines. Follow these instructions at home: Sexual activity  Tell your sex partner or partners about your infection. These include any partners for oral, anal, or vaginal sex that you have had within 60 days of when your symptoms started. Sex partners should also be treated, even if they have no signs of the disease.  Do not have sex until you and your sex partners have completed treatment and your health care provider says it is okay. If your health care provider prescribed you a single-dose medicine as treatment, wait at least 7 days after taking the medicine before having sex. General instructions  Take over-the-counter and prescription medicines as told by your health care provider. Finish all antibiotic medicine even when you start to feel better.  It is up to you to get your test results. Ask your health care provider, or the department that is doing the test, when your results will be ready.  Get plenty of rest.  Eat a healthy, well-balanced diet.  Drink enough fluids to keep your urine pale yellow.  Keep all follow-up visits as told by your health care provider. This is important. You may need to be tested for infection again 3 months after treatment. How is this prevented? The only sure way to prevent chlamydia is to avoid having vaginal, anal, and oral sex. However, you can  lower your risk by:  Using latex condoms correctly every time you have sex.  Not having multiple sexual partners.  Asking if your sex partner has been tested for STIs and had negative results.  Getting regular health screenings to check for STIs.   Contact a health care provider if:  You develop new symptoms or your symptoms do not get better  after you complete treatment.  You have a fever or chills.  You have pain during sex.  You develop new joint pain or swelling near your joints.  You have pain or soreness in your testicles. Get help right away if:  Your pain gets worse and does not get better with medicine.  You have abnormal discharge.  You develop flu-like symptoms, such as night sweats, sore throat, or muscle aches. Summary  Chlamydia is an STI (sexually transmitted infection) that is caused by bacteria. This infection spreads through sexual contact.  This condition is not difficult to treat. However, if left untreated, it can lead to more serious health problems.  Some people have no symptoms (are asymptomatic), especially early in the infection.  This condition is treated with antibiotic medicines.  Using latex condoms correctly every time you have sex can help prevent chlamydia. This information is not intended to replace advice given to you by your health care provider. Make sure you discuss any questions you have with your health care provider. Document Revised: 05/20/2019 Document Reviewed: 05/20/2019 Elsevier Patient Education  2021 [employer].

## 2020-07-10 NOTE — Progress Notes (Signed)
Established Patient Office Visit  Subjective:  Patient ID: Jeffrey Frost, male    DOB: 1995-12-31  Age: 25 y.o. MRN: [id number]  CC:  Chief Complaint  Patient presents with  . Exposure to STD    HPI Jeffrey Frost presents for STD check. Girlfriend of about one year tested positive yesterday for Chlamydia.  They were having unprotected sex. Pt states that he has never tested positive for any STDs. Patient was also tested 2 weeks ago for GC, syphilis, and HIV, all of which were negative.  He states that he has started to have a scratchy throat.  He denies any dysuria, pain, penile discharge, swelling, or abdominal pain.  No fevers or weight changes.   Past Medical History:  Diagnosis Date  . Asthma    as a child   . Chiari I malformation (HCC) 2016   s/p decompression surgery with Dr. Thad Ranger In [place]. still does appointments there.     Past Surgical History:  Procedure Laterality Date  . ADENOIDECTOMY    . Chiari decompression  11/24/2015  . TONSILLECTOMY      Family History  Problem Relation Age of Onset  . Crohn's disease Mother   . Hypertension Father        not sure which it is HTN or HLD  . Hyperlipidemia Father        not close to father  . Hyperlipidemia Maternal Grandmother   . Other Maternal Grandfather        sepsis. and may have had heart related issues  . Other Paternal Grandmother        thinks pacemaker- limited relationship  . Other Paternal Grandfather        limited relationship    Social History   Socioeconomic History  . Marital status: Single    Spouse name: Not on file  . Number of children: Not on file  . Years of education: Not on file  . Highest education level: Not on file  Occupational History  . Occupation: Customer service manager: OTHER    Comment: [employer]  Tobacco Use  . Smoking status: Former Games developer  . Smokeless tobacco: Never Used  Vaping Use  . Vaping Use: Former  Substance and Sexual Activity  . Alcohol use: Yes     Comment: social drinker- maybe one day on weekend about 5   . Drug use: Never  . Sexual activity: Yes    Birth control/protection: Condom  Other Topics Concern  . Not on file  Social History Narrative   Single. Lives with a roommate. 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    He is interested in Multimedia programmer estate and Production assistant, radio.       Hobbies: working out, time with friends, family, travel.    Social Determinants of Health   Financial Resource Strain: Not on file  Food Insecurity: Not on file  Transportation Needs: Not on file  Physical Activity: Not on file  Stress: Not on file  Social Connections: Not on file  Intimate Partner Violence: Not on file    Outpatient Medications Prior to Visit  Medication Sig Dispense Refill  . Ascorbic Acid (VITAMIN C) 500 MG CAPS Take 1 capsule by mouth as needed.    . ISOtretinoin (ACCUTANE) 40 MG capsule Take by mouth.    . naproxen (NAPROSYN) 500 MG tablet Take 1 tablet (500 mg total) by  mouth 2 (two) times daily as needed for moderate pain. 30 tablet 3   No facility-administered medications prior to visit.    Allergies  Allergen Reactions  . Amitriptyline     Other reaction(s):  auditory hallucinations after chiari malformation surgery  . Gabapentin     Other reaction(s): auditory hallucinations after chiari malformation surgery    ROS Review of Systems REFER TO HPI FOR PERTINENT POSITIVES AND NEGATIVES    Objective:    Physical Exam Constitutional:      Appearance: Normal appearance.  Cardiovascular:     Rate and Rhythm: Normal rate and regular rhythm.     Pulses: Normal pulses.     Heart sounds: Normal heart sounds.  Pulmonary:     Effort: Pulmonary effort is normal.     Breath sounds: Normal breath sounds.  Abdominal:     General: Abdomen is flat.     Palpations: Abdomen is soft.     Tenderness: There is no abdominal tenderness.  Genitourinary:     Comments: DEFERRED EXAM DUE TO NO COMPLAINTS Neurological:     Mental Status: He is alert.  Psychiatric:        Mood and Affect: Mood normal.        Behavior: Behavior normal.     BP 113/68   Pulse 82   Temp 98.4 F (36.9 C)   Ht 6' (1.829 m)   Wt 193 lb 3.2 oz (87.6 kg)   SpO2 99%   BMI 26.20 kg/m  Wt Readings from Last 3 Encounters:  07/10/20 193 lb 3.2 oz (87.6 kg)  07/08/20 189 lb 3.2 oz (85.8 kg)  06/24/20 184 lb (83.5 kg)     Health Maintenance Due  Topic Date Due  . COVID-19 Vaccine (1) Never done    There are no preventive care reminders to display for this patient.  Lab Results  Component Value Date   TSH 0.68 04/17/2020   Lab Results  Component Value Date   WBC 6.2 04/17/2020   HGB 14.4 04/17/2020   HCT 43.3 04/17/2020   MCV 89.1 04/17/2020   PLT 283 04/17/2020   Lab Results  Component Value Date   NA 135 06/23/2020   K 3.5 06/23/2020   CO2 28 06/23/2020   GLUCOSE 89 06/23/2020   BUN 14 06/23/2020   CREATININE 1.10 06/23/2020   BILITOT 0.7 06/23/2020   ALKPHOS 60 06/23/2020   AST 88 (H) 06/23/2020   ALT 84 (H) 06/23/2020   PROT 7.8 06/23/2020   ALBUMIN 4.7 06/23/2020   CALCIUM 10.2 06/23/2020   GFR 93.74 06/23/2020   Lab Results  Component Value Date   CHOL 176 04/17/2020   Lab Results  Component Value Date   HDL 52 04/17/2020   Lab Results  Component Value Date   LDLCALC 108 (H) 04/17/2020   Lab Results  Component Value Date   TRIG 72 04/17/2020   Lab Results  Component Value Date   CHOLHDL 3.4 04/17/2020   No results found for: HGBA1C    Assessment & Plan:   Problem List Items Addressed This Visit   None   Visit Diagnoses    Screening for gonorrhea    -  Primary   Relevant Medications   azithromycin (ZITHROMAX) 500 MG tablet   cefTRIAXone (ROCEPHIN) injection 1 g (Start on 07/10/2020 10:15 AM)   Other Relevant Orders   GC/Chlamydia Probe Amp   Screening for chlamydial disease       Relevant Medications  azithromycin (ZITHROMAX) 500 MG tablet   cefTRIAXone (ROCEPHIN) injection 1 g (Start on 07/10/2020 10:15 AM)   Other Relevant Orders   GC/Chlamydia Probe Amp   Exposure to chlamydia       Relevant Medications   azithromycin (ZITHROMAX) 500 MG tablet   cefTRIAXone (ROCEPHIN) injection 1 g (Start on 07/10/2020 10:15 AM)   Other Relevant Orders   GC/Chlamydia Probe Amp      Meds ordered this encounter  Medications  . azithromycin (ZITHROMAX) 500 MG tablet    Sig: Take 2 tablets (1,000 mg total) by mouth daily for 1 day.    Dispense:  1 tablet    Refill:  0  . cefTRIAXone (ROCEPHIN) injection 1 g    Follow-up: No follow-ups on file.   1. Screening for gonorrhea  2. Screening for chlamydial disease  3. Exposure to chlamydia As he was just exposed to this weekend to chlamydia and notified yesterday of this positive contact, I went ahead and treated him today with 1 g Rocephin IM in the office as well as sent in Zithromax 1000 mg to the pharmacy for him.  He is taking isotretinoin so doxycycline was not an option.  We will also test the urine today.  He is to abstain from intercourse for the next 7 days at least.  He should always use condoms.  Safe sex counseling advised.  This visit occurred during the SARS-CoV-2 public health emergency.  Safety protocols were in place, including screening questions prior to the visit, additional usage of staff PPE, and extensive cleaning of exam room while observing appropriate contact time as indicated for disinfecting solutions.    Emmaclaire Switala M Kaysan Peixoto, PA-C

## 2020-07-13 ENCOUNTER — Telehealth: Payer: Self-pay

## 2020-07-13 LAB — GC/CHLAMYDIA PROBE AMP
Chlamydia trachomatis, NAA: POSITIVE — AB
Neisseria Gonorrhoeae by PCR: NEGATIVE

## 2020-07-13 NOTE — Telephone Encounter (Signed)
Patient needs another note since seeing you at the last visit. Patient states that the last note said he would be out for 2-4 weeks, but since he is up on the two weeks he needs another one just stating patient could be out another two weeks or so on. Patient would like a call back when done.

## 2020-07-14 ENCOUNTER — Encounter: Payer: Self-pay | Admitting: Family Medicine

## 2020-07-14 ENCOUNTER — Telehealth: Payer: Self-pay

## 2020-07-14 NOTE — Telephone Encounter (Signed)
Called pt and informed him that letter has been sent through MyChart.  He asked that the letter be printed and signed by Dr. Denyse Amass and he will stop by the office today to pick it up.

## 2020-07-14 NOTE — Telephone Encounter (Signed)
Pt wants to know if he should still be scheduling for Physical Therapy

## 2020-07-14 NOTE — Telephone Encounter (Signed)
Letter sent via MyChart.  We can print a letter as well if needed.  I extended leave until March 14.  He has a follow-up appointment with me March 10.

## 2020-07-17 ENCOUNTER — Other Ambulatory Visit: Payer: Self-pay

## 2020-07-17 ENCOUNTER — Encounter: Payer: Self-pay | Admitting: Physical Therapy

## 2020-07-17 ENCOUNTER — Ambulatory Visit: Payer: No Typology Code available for payment source | Admitting: Physical Therapy

## 2020-07-17 DIAGNOSIS — M25572 Pain in left ankle and joints of left foot: Secondary | ICD-10-CM | POA: Diagnosis not present

## 2020-07-17 NOTE — Therapy (Signed)
[facility] [address] [place], [place], [address] Phone: [phone]   Fax:  [phone]  Physical Therapy Treatment  Patient Details  Name: Sirr Kabel MRN: [id number] Date of Birth: [birth year] Referring Provider (PT): Clementeen Graham   Encounter Date: 07/17/2020   PT End of Session - 07/17/20 0913    Visit Number 8    Number of Visits 12    Date for PT Re-Evaluation 07/21/20    Authorization Type [employer] [place]    PT Start Time 0920    PT Stop Time 1005    PT Time Calculation (min) 45 min    Activity Tolerance Patient tolerated treatment well    Behavior During Therapy [facility] for tasks assessed/performed           Past Medical History:  Diagnosis Date  . Asthma    as a child   . Chiari I malformation (HCC) 2016   s/p decompression surgery with Dr. Thad Ranger In [place]. still does appointments there.     Past Surgical History:  Procedure Laterality Date  . ADENOIDECTOMY    . Chiari decompression  11/24/2015  . TONSILLECTOMY      There were no vitals filed for this visit.   Subjective Assessment - 07/17/20 0917    Subjective Pt states that his ankle is still a little sore in the anterior lateral aspect of the ankle bc of his workout yesterday. He states he has been doing more at the gym recently so that may be the cause. He continues to wear the brace outside of clinic while exercising.    Pertinent History 2017 brain surgery for chiari malformation    Limitations Lifting;Standing;Walking;House hold activities    Patient Stated Goals Decreased pain, return to work    Currently in Pain? Yes    Pain Score 2     Pain Location Ankle    Pain Orientation Left    Pain Descriptors / Indicators Aching;Sore    Pain Onset 1 to 4 weeks ago    Aggravating Factors  resistance activity, after working out    Pain Relieving Factors resting, icing                             [facility] Adult PT Treatment/Exercise -  07/17/20 0001      Therapeutic Activites    Therapeutic Activities Lifting    Lifting Front squatting 45 lbs 2x10      Exercises   Exercises Ankle      Manual Therapy   Manual Therapy Joint mobilization;Soft tissue mobilization    Joint Mobilization L post TC glide, grade III    Soft tissue mobilization L gastroc soleus      Ankle Exercises: Aerobic   Tread Mill 2.5 mph walk, grade 3 incline, x 5 min      Ankle Exercises: Plyometrics   Plyometric Exercises Runner's step up 2nd stair x10 bil,  with airex  x10 bil      Ankle Exercises: Standing   Other Standing Ankle Exercises Bsu squats and lateral quick step 2x10; SLS on foam with ball toss 3x20; march back and lateral with manual resistance 4x, SL squat from chair onto foam 2x10    Other Standing Ankle Exercises SLS on foam with barbell pass                  PT Education - 07/17/20 0912    Education Details HEP, safe  return jogging without brace treadmill, wear brace outdoors, exercise progression, avoiding large amplitude plyometrics outside of clinic    Person(s) Educated Patient    Methods Explanation;Demonstration    Comprehension Verbalized understanding;Returned demonstration            PT Short Term Goals - 06/23/20 1130      PT SHORT TERM GOAL #1   Title Pt to be independent with initial HEP    Time 2    Period Weeks    Status Achieved    Target Date 06/23/20             PT Long Term Goals - 06/21/20 0822      PT LONG TERM GOAL #1   Title Pt to be independent wtih final HEP    Time 6    Period Weeks    Status New    Target Date 07/21/20      PT LONG TERM GOAL #2   Title Pt to demo strength of L ankle to be at least 4+/5 to improve stability and gait    Time 6    Period Weeks    Status New    Target Date 07/21/20      PT LONG TERM GOAL #3   Title Pt to report decreased pain in ankle to 0-/10 with standing, walking, and dynamic activity, for ability to return to work.    Time 6     Period Weeks    Status New    Target Date 07/21/20      PT LONG TERM GOAL #4   Title Pt to demo stability of L ankle to be WNL for single leg and dynamic movements for safe return to work.    Time 6    Period Weeks    Status New    Target Date 07/21/20                 Plan - 07/17/20 0915    Clinical Impression Statement Pt was able to progress today's session with more single leg and plyometric activity without increasing pain. Pt has difficulty with SL balance activity, especially with dynamic resisted movements. Pt responded well to ankle mob and gastroc soleus STM increased in ROM and decreased pain after. Pt likely able to return to level surface jogging/running with ankle brace on. Pt gave verbal understanding to edu regarding avoiding sport related activity. Pt would benefit from continued skilled therapy in order to maximize LE function and ankle stability for prevention of future sprain and return to PLOF.    Examination-Activity Limitations Locomotion Level;Carry;Squat;Stairs;Stand;Lift    Examination-Participation Restrictions Occupation;Cleaning;Community Activity;Driving;Meal Prep    Stability/Clinical Decision Making Stable/Uncomplicated    Rehab Potential Good    PT Frequency 2x / week    PT Duration 6 weeks    PT Treatment/Interventions ADLs/Self Care Home Management;Canalith Repostioning;Cryotherapy;Psychologist, educational;Iontophoresis 4mg /ml Dexamethasone;Moist Heat;Traction;Ultrasound;Balance training;Therapeutic exercise;Therapeutic activities;Functional mobility training;Stair training;Gait training;Cognitive remediation;Patient/family education;Orthotic Fit/Training;Passive range of motion;Manual techniques;Dry needling;Splinting;Taping;Vasopneumatic Device;Spinal Manipulations;Joint Manipulations    Consulted and Agree with Plan of Care Patient           Patient will benefit from skilled therapeutic intervention in order to improve the  following deficits and impairments:  Abnormal gait,Decreased range of motion,Increased muscle spasms,Decreased activity tolerance,Pain,Decreased balance,Impaired flexibility,Decreased strength,Decreased mobility,Increased edema  Visit Diagnosis: Pain in left ankle and joints of left foot     Problem List Patient Active Problem List   Diagnosis Date Noted  . High ankle sprain, left, initial  encounter 05/29/2020  . Chiari I malformation [facility]) 2016    Zebedee Iba PT, DPT 07/17/20 10:16 AM   [facility] [address] [place], [place], [address] Phone: [phone]   Fax:  [phone]  Name: Dakarai Mcglocklin MRN: [id number] Date of Birth: Mar 11, 1996

## 2020-07-23 ENCOUNTER — Ambulatory Visit: Payer: No Typology Code available for payment source | Admitting: Family Medicine

## 2020-07-24 ENCOUNTER — Ambulatory Visit: Payer: No Typology Code available for payment source | Admitting: Family Medicine

## 2020-07-24 ENCOUNTER — Encounter: Payer: No Typology Code available for payment source | Admitting: Physical Therapy

## 2020-07-27 ENCOUNTER — Ambulatory Visit (INDEPENDENT_AMBULATORY_CARE_PROVIDER_SITE_OTHER): Payer: No Typology Code available for payment source | Admitting: Family Medicine

## 2020-07-27 ENCOUNTER — Ambulatory Visit: Payer: No Typology Code available for payment source | Admitting: Physical Therapy

## 2020-07-27 ENCOUNTER — Other Ambulatory Visit: Payer: Self-pay

## 2020-07-27 ENCOUNTER — Encounter: Payer: Self-pay | Admitting: Family Medicine

## 2020-07-27 ENCOUNTER — Telehealth: Payer: Self-pay | Admitting: Family Medicine

## 2020-07-27 ENCOUNTER — Encounter: Payer: Self-pay | Admitting: Physical Therapy

## 2020-07-27 VITALS — BP 102/68 | HR 74 | Ht 72.0 in | Wt 184.4 lb

## 2020-07-27 DIAGNOSIS — G935 Compression of brain: Secondary | ICD-10-CM | POA: Diagnosis not present

## 2020-07-27 DIAGNOSIS — S93492A Sprain of other ligament of left ankle, initial encounter: Secondary | ICD-10-CM | POA: Diagnosis not present

## 2020-07-27 DIAGNOSIS — R202 Paresthesia of skin: Secondary | ICD-10-CM | POA: Diagnosis not present

## 2020-07-27 DIAGNOSIS — M25572 Pain in left ankle and joints of left foot: Secondary | ICD-10-CM | POA: Diagnosis not present

## 2020-07-27 NOTE — Progress Notes (Signed)
I, Christoper Fabian, LAT, ATC, am serving as scribe for Dr. Clementeen Graham.  Jeffrey Frost is a 25 y.o. male who presents to [facility] today for f/u of L ankle pain due to a syndesmotic sprain that he suffered on 05/28/20 when he rolled his ankle while stepping down from a ladder. He was last seen by Dr. Denyse Amass on 07/08/20 and noted improvement in his L ankle pain and inflammation w/ most pain noted w/ L ankle PF AROM.  He has completed 8 PT sessions and has been compliant in wearing his ASO ankle brace.  Since his last visit, pt reports that his L ankle is about the same as it was at his last visit.  He states that he has been doing more activity but notes that it feels about the same.  He notes an overall 70% improvement since the time of his injury.  He reports a con't pinching sensation at his L post ankle/Achille's w/ L ankle PF.  He also reports con't L lateral ankle pain w/ full L ankle inversion.  He has not returned to work at this point.  On a side note, he also reports having gotten an MRI of his brain and brainstem due to getting numbness/tingling in his B UEs and LEs.  This was done outside of the scope of my care by his prior neurosurgeon.  He has a history of Chiari malformation with surgery.  He reports that the MRI brain and cervical spine was normal.  He is currently having B foot and hand numbness.  Diagnostic imaing: L ankle XR- 06/05/20, 05/29/20; L foot XR- 06/05/20  Pertinent review of systems: No fevers or chills  Relevant historical information: Chiari I malformation   Exam:  BP 102/68 (BP Location: Right Arm, Patient Position: Sitting, Cuff Size: Normal)   Pulse 74   Ht 6' (1.829 m)   Wt 184 lb 6.4 oz (83.6 kg)   SpO2 97%   BMI 25.01 kg/m  General: Well Developed, well nourished, and in no acute distress.   MSK: Left ankle normal motion     Assessment and Plan: 25 y.o. male with left ankle sprain mostly improved.  Patient should be at this  point able to return to work at full duty.  He does have some numbness and tingling to his hands and ankles that is probably not related to his ankle sprain but is not clear at this time.  He notes that the numbness and tingling did start about a week after his ankle sprain.  This has been worked up independently to his ankle sprain by his neurosurgeon with an MRI of his brain and cervical spine that was normal.  It is possible that this does relate to his injury but I am not certain at this time.  Will refer to neurology.  I do not think this will impact his ability to return to work.  Plan for trial to return to work without restriction.  If unable to do so will modify work restrictions. Consider recheck back in 1 month if needed.   PDMP not reviewed this encounter. Orders Placed This Encounter  Procedures  . Ambulatory referral to Neurology    Referral Priority:   Routine    Referral Type:   Consultation    Referral Reason:   Specialty Services Required    Requested Specialty:   Neurology    Number of Visits Requested:   1   No orders of the  defined types were placed in this encounter.    Discussed warning signs or symptoms. Please see discharge instructions. Patient expresses understanding.   The above documentation has been reviewed and is accurate and complete Lynne Leader, M.D.

## 2020-07-27 NOTE — Patient Instructions (Addendum)
Thank you for coming in today.  Plan for trial of return to work.   Refer to neurology.   Let me know.   Keep me updated.

## 2020-07-27 NOTE — Therapy (Signed)
[facility] [address] [place], [place], [address] Phone: [phone]   Fax:  [phone]  Physical Therapy Treatment  Patient Details  Name: Jeffrey Frost MRN: [id number] Date of Birth: 11-28-1995 Referring Provider (PT): Clementeen Graham   Encounter Date: 07/27/2020   PT End of Session - 07/27/20 1055    Visit Number 9    Number of Visits 12    Date for PT Re-Evaluation 07/21/20    Authorization Type [employer]    PT Start Time 0935    PT Stop Time 1015    PT Time Calculation (min) 40 min    Activity Tolerance Patient tolerated treatment well    Behavior During Therapy [facility] for tasks assessed/performed           Past Medical History:  Diagnosis Date  . Asthma    as a child   . Chiari I malformation (HCC) 2016   s/p decompression surgery with Dr. Thad [place]. still does appointments there.     Past Surgical History:  Procedure Laterality Date  . ADENOIDECTOMY    . Chiari decompression  11/24/2015  . TONSILLECTOMY      There were no vitals filed for this visit.   Subjective Assessment - 07/27/20 0937    Subjective Pt states that his ankle is continuing to get better. He has been able to run outside without a brace on without issues. He states he is doing that on level ground.    Pertinent History 2017 brain surgery for chiari malformation    Limitations Lifting;Standing;Walking;House hold activities    Patient Stated Goals Decreased pain, return to work    Currently in Pain? Yes    Pain Score 2     Pain Location Ankle    Pain Orientation Left    Pain Descriptors / Indicators Aching;Sore    Pain Onset 1 to 4 weeks ago                             [facility] Adult PT Treatment/Exercise - 07/27/20 0001      Therapeutic Activites    Therapeutic Activities Lifting    Lifting --      Exercises   Exercises Ankle      Manual Therapy   Manual Therapy Joint mobilization;Soft tissue mobilization     Joint Mobilization --    Soft tissue mobilization --      Ankle Exercises: Aerobic   Tread Mill 3 mph walk, grade 3 incline, x 5 min      Ankle Exercises: Plyometrics   Plyometric Exercises Agility Ladder: fwd, lateral, shuffle, hopping, in and out (fwd and lateral)   3x each   Plyometric Exercises A-skipping 55ft 3x     Ankle Exercises: Standing   Other Standing Ankle Exercises Bosu split squat with paloff press 3x10 each    Other Standing Ankle Exercises SL RDL on airex 3x10                  PT Education - 07/27/20 0943    Education Details HEP, safe return jogging, low level plyometrics, wear brace with sprinting, exercise progression    Person(s) Educated Patient    Methods Explanation;Demonstration    Comprehension Verbalized understanding;Returned demonstration            PT Short Term Goals - 06/23/20 1130      PT SHORT TERM GOAL #1   Title Pt to  be independent with initial HEP    Time 2    Period Weeks    Status Achieved    Target Date 06/23/20             PT Long Term Goals - 06/21/20 0822      PT LONG TERM GOAL #1   Title Pt to be independent wtih final HEP    Time 6    Period Weeks    Status New    Target Date 07/21/20      PT LONG TERM GOAL #2   Title Pt to demo strength of L ankle to be at least 4+/5 to improve stability and gait    Time 6    Period Weeks    Status New    Target Date 07/21/20      PT LONG TERM GOAL #3   Title Pt to report decreased pain in ankle to 0-/10 with standing, walking, and dynamic activity, for ability to return to work.    Time 6    Period Weeks    Status New    Target Date 07/21/20      PT LONG TERM GOAL #4   Title Pt to demo stability of L ankle to be WNL for single leg and dynamic movements for safe return to work.    Time 6    Period Weeks    Status New    Target Date 07/21/20                 Plan - 07/27/20 1055    Clinical Impression Statement Pt continues to demonstrates progress  with L ankle strength and dynamic stability at today's session. Pt was able to incorporate SL activity with external perturbations as well as plyometrics with agility ladder without increasing pain. Pt does report of lateral ankle soreness after running at home  which is likely due to volume of loading vs instability or re-sprain. Pt likely able to progress to higher level plyometrics at next session. Pt would benefit from continued skilled therapy in order to maximize LE function and ankle stability for prevention of future sprain and return to PLOF.    Examination-Activity Limitations Locomotion Level;Carry;Squat;Stairs;Stand;Lift    Examination-Participation Restrictions Occupation;Cleaning;Community Activity;Driving;Meal Prep    Stability/Clinical Decision Making Stable/Uncomplicated    Rehab Potential Good    PT Frequency 2x / week    PT Duration 6 weeks    PT Treatment/Interventions ADLs/Self Care Home Management;Canalith Repostioning;Cryotherapy;Psychologist, educational;Iontophoresis 4mg /ml Dexamethasone;Moist Heat;Traction;Ultrasound;Balance training;Therapeutic exercise;Therapeutic activities;Functional mobility training;Stair training;Gait training;Cognitive remediation;Patient/family education;Orthotic Fit/Training;Passive range of motion;Manual techniques;Dry needling;Splinting;Taping;Vasopneumatic Device;Spinal Manipulations;Joint Manipulations    PT Home Exercise Plan [id number]    Consulted and Agree with Plan of Care Patient           Patient will benefit from skilled therapeutic intervention in order to improve the following deficits and impairments:  Abnormal gait,Decreased range of motion,Increased muscle spasms,Decreased activity tolerance,Pain,Decreased balance,Impaired flexibility,Decreased strength,Decreased mobility,Increased edema  Visit Diagnosis: Pain in left ankle and joints of left foot     Problem List Patient Active Problem List   Diagnosis Date  Noted  . High ankle sprain, left, initial encounter 05/29/2020  . Chiari I malformation [facility]) 2016    2017 PT, DPT 07/27/20 11:02 AM   [facility] [address] [place], [place], [place] Phone: [phone]   Fax:  [phone]  Name: Jeffrey Frost MRN: Raford Pitcher Date of Birth: 02-Dec-1995

## 2020-07-31 ENCOUNTER — Encounter: Payer: Self-pay | Admitting: Family Medicine

## 2020-07-31 ENCOUNTER — Other Ambulatory Visit: Payer: Self-pay

## 2020-07-31 ENCOUNTER — Other Ambulatory Visit: Payer: No Typology Code available for payment source

## 2020-07-31 ENCOUNTER — Other Ambulatory Visit (HOSPITAL_COMMUNITY)
Admission: RE | Admit: 2020-07-31 | Discharge: 2020-07-31 | Disposition: A | Discharge status: Home / Self Care | Payer: BLUE CROSS/BLUE SHIELD | Source: Ambulatory Visit | Attending: Family Medicine | Admitting: Family Medicine

## 2020-07-31 DIAGNOSIS — Z113 Encounter for screening for infections with a predominantly sexual mode of transmission: Secondary | ICD-10-CM

## 2020-07-31 DIAGNOSIS — Z118 Encounter for screening for other infectious and parasitic diseases: Secondary | ICD-10-CM | POA: Diagnosis present

## 2020-08-03 ENCOUNTER — Telehealth: Payer: Self-pay

## 2020-08-03 DIAGNOSIS — Z118 Encounter for screening for other infectious and parasitic diseases: Secondary | ICD-10-CM

## 2020-08-03 DIAGNOSIS — Z113 Encounter for screening for infections with a predominantly sexual mode of transmission: Secondary | ICD-10-CM

## 2020-08-03 DIAGNOSIS — Z114 Encounter for screening for human immunodeficiency virus [HIV]: Secondary | ICD-10-CM

## 2020-08-03 LAB — URINE CYTOLOGY ANCILLARY ONLY
Chlamydia: NEGATIVE
Comment: NEGATIVE
Comment: NORMAL
Neisseria Gonorrhea: NEGATIVE

## 2020-08-03 NOTE — Telephone Encounter (Signed)
Lab is aware of all orders.

## 2020-08-03 NOTE — Telephone Encounter (Signed)
Patient would like orders put in for another urine test to be done because his partner has been tested and her result was positive he would like to be rechecked  Please advise?

## 2020-08-03 NOTE — Telephone Encounter (Signed)
Patient has a LAB appointment tomorrow at 4pm. But he wants to know can he have all STD testing tomorrow at that lab appointment also because his partner is positive for chlamydia.

## 2020-08-03 NOTE — Telephone Encounter (Signed)
I ordered these but make sure to talk to labs to do labs from today and labs previously ordered

## 2020-08-04 ENCOUNTER — Ambulatory Visit (INDEPENDENT_AMBULATORY_CARE_PROVIDER_SITE_OTHER): Payer: No Typology Code available for payment source | Admitting: Physical Therapy

## 2020-08-04 ENCOUNTER — Other Ambulatory Visit: Payer: Self-pay

## 2020-08-04 ENCOUNTER — Encounter: Payer: Self-pay | Admitting: Physical Therapy

## 2020-08-04 ENCOUNTER — Other Ambulatory Visit (HOSPITAL_COMMUNITY)
Admission: RE | Admit: 2020-08-04 | Discharge: 2020-08-04 | Disposition: A | Discharge status: Home / Self Care | Payer: BLUE CROSS/BLUE SHIELD | Source: Ambulatory Visit | Attending: Family Medicine | Admitting: Family Medicine

## 2020-08-04 ENCOUNTER — Other Ambulatory Visit (INDEPENDENT_AMBULATORY_CARE_PROVIDER_SITE_OTHER): Payer: No Typology Code available for payment source

## 2020-08-04 DIAGNOSIS — Z118 Encounter for screening for other infectious and parasitic diseases: Secondary | ICD-10-CM

## 2020-08-04 DIAGNOSIS — Z113 Encounter for screening for infections with a predominantly sexual mode of transmission: Secondary | ICD-10-CM | POA: Insufficient documentation

## 2020-08-04 DIAGNOSIS — Z114 Encounter for screening for human immunodeficiency virus [HIV]: Secondary | ICD-10-CM

## 2020-08-04 DIAGNOSIS — M25572 Pain in left ankle and joints of left foot: Secondary | ICD-10-CM | POA: Diagnosis not present

## 2020-08-04 DIAGNOSIS — R7989 Other specified abnormal findings of blood chemistry: Secondary | ICD-10-CM

## 2020-08-04 NOTE — Therapy (Signed)
[facility] [address] [place], [place], [address] Phone: [phone]   Fax:  [phone]  Physical Therapy Treatment/Progress Note  Patient Details  Name: Jeffrey Frost MRN: [id number] Date of Birth: July 21, 1995 Referring Provider (PT): Lynne Leader   Encounter Date: 08/04/2020   PT End of Session - 08/04/20 1202    Visit Number 10    Number of Visits 12    Date for PT Re-Evaluation 07/21/20    Authorization Type [employer]    PT Start Time 1105    PT Stop Time 1148    PT Time Calculation (min) 43 min    Activity Tolerance Patient tolerated treatment well    Behavior During Therapy [facility] for tasks assessed/performed           Past Medical History:  Diagnosis Date  . Asthma    as a child   . 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 Doy Mince In [place]. still does appointments there.     Past Surgical History:  Procedure Laterality Date  . ADENOIDECTOMY    . Chiari decompression  11/24/2015  . TONSILLECTOMY      There were no vitals filed for this visit.   Subjective Assessment - 08/04/20 1200    Subjective Pt states that his ankle is still getting better but he still has pinching into the posterior aspect of his ankle as well as into the lateral side on occasion after exercise or at end range. Pt complains of bilat UE and LE NT as well as recent fatigue within the last two weeks.    Pertinent History 2017 brain surgery for chiari malformation    Limitations Lifting;Standing;Walking;House hold activities    Patient Stated Goals Decreased pain, return to work    Currently in Pain? No/denies    Pain Score 0-No pain    Pain Orientation Left    Pain Onset 1 to 4 weeks ago              [facility] PT Assessment - 08/04/20 0001      Assessment   Medical Diagnosis L ankle pain/sprain    Referring Provider (PT) Lynne Leader    Onset Date/Surgical Date 05/28/20    Prior Therapy no       Restrictions   Weight Bearing Restrictions No      Prior Function   Level of Independence Independent      Cognition   Overall Cognitive Status Within Functional Limits for tasks assessed      Coordination   Gross Motor Movements are Fluid and Coordinated Yes    Finger Nose Finger Test WNL   (-) Hoffman's (-) Rapid Alt Movement     Functional Tests   Functional tests Lunges;Squat      Squat   Comments anterior impingement pain      Lunges   Comments anterior impingement across TCJ      AROM   Overall AROM Comments L ankle ROM: WNL (pinching pain with PF)      Strength   Overall Strength Comments DF: 5/5 PF: 5/5 IV: 4+/5 EV: 4+/5      Palpation   Palpation comment no pain with palpation with anterior drawer or talar tilt      Special Tests   Other special tests SLS: able to perform >30s on pliant surface, able to perform SLS squatting with minor pain into ant ankle and Achilles      Ambulation/Gait  Gait Comments Toe out position on L, tendency to favor IV/supination with midstance      High Level Balance   High Level Balance Activites Direction changes;Sudden stops    High Level Balance Comments able to perform without pain, hesitation and decreased power with lateral movements                         [facility] Adult PT Treatment/Exercise - 08/04/20 0001      Therapeutic Activites    Therapeutic Activities Lifting    Lifting Front squatting 45 lbs 2x10      Exercises   Exercises Ankle      Manual Therapy   Manual Therapy Joint mobilization;Soft tissue mobilization    Joint Mobilization L post TC glide, grade III    Soft tissue mobilization L gastroc soleus      Ankle Exercises: Aerobic   Tread Mill 2.5 mph walk, grade 3 incline, x 5 min      Ankle Exercises: Plyometrics   Plyometric Exercises Runner's step up 2nd stair x10 bil,  with airex  x10 bil      Ankle Exercises: Standing   Other Standing Ankle Exercises Bsu squats and lateral quick  step 2x10; SLS on foam with ball toss 3x20; march back and lateral with manual resistance 4x, SL squat from chair 2x10    Other Standing Ankle Exercises SLS on foam with barbell pass                  PT Education - 08/04/20 1201    Education Details HEP, adequate recovery, strength training, safety with plyometrics, exercise progression, MS symptoms and presentation, examination findings/results/interpretations   Person(s) Educated Patient    Methods Explanation;Demonstration    Comprehension Verbalized understanding;Returned demonstration            PT Short Term Goals - 06/23/20 1130      PT SHORT TERM GOAL #1   Title Pt to be independent with initial HEP    Time 2    Period Weeks    Status Achieved    Target Date 06/23/20             PT Long Term Goals - 08/04/20 1213      PT LONG TERM GOAL #1   Title Pt to be independent wtih final HEP    Time 6    Period Weeks    Status Achieved      PT LONG TERM GOAL #2   Title Pt to demo strength of L ankle to be at least 4+/5 to improve stability and gait    Time 6    Period Weeks    Status Achieved      PT LONG TERM GOAL #3   Title Pt to report decreased pain in ankle to 0-/10 with standing, walking, and dynamic activity, for ability to return to work.    Time 6    Period Weeks    Status On-going      PT LONG TERM GOAL #4   Title Pt to demo stability of L ankle to be WNL for single leg and dynamic movements for safe return to work.    Time 6    Period Weeks    Status Partially Met                 Plan - 08/04/20 1206    Clinical Impression Statement Pt demonstrates signficant improvement with ankle ROM and strength.  Pt able to perform low level plyometrics and high level balance activities in clinic with good stability but pt does continue to complain of posterior pinching pain as well and L anterior ankle impingement with end range movements. Pt was given thorough edu re load management with working  out due to potential for overloading ankle and Achilles tendon with gym workout routine. Pt likely able to DC ankle episode at next visit or soon thereafter. Pt gave verbal understanding. Due to pt complaints of bilat UE and LE NT with new onset of fatigue, pt was given edu about MS sx presentation and cursory examination. Pt has neuro referral scheduled for next week. Testing in clinic today did not suggest UMN lesion, though onset of sx is relatively new. Pt would benefit from continued skilled therapy follow up in order to maximize functional L LE function for return to PLOF.    Examination-Activity Limitations Locomotion Level;Carry;Squat;Stairs;Stand;Lift    Examination-Participation Restrictions Occupation;Cleaning;Community Activity;Driving;Meal Prep    Stability/Clinical Decision Making Stable/Uncomplicated    Rehab Potential Good    PT Frequency 2x / week    PT Duration 6 weeks    PT Treatment/Interventions ADLs/Self Care Home Management;Canalith Repostioning;Cryotherapy;Scientist, product/process development;Iontophoresis 40m/ml Dexamethasone;Moist Heat;Traction;Ultrasound;Balance training;Therapeutic exercise;Therapeutic activities;Functional mobility training;Stair training;Gait training;Cognitive remediation;Patient/family education;Orthotic Fit/Training;Passive range of motion;Manual techniques;Dry needling;Splinting;Taping;Vasopneumatic Device;Spinal Manipulations;Joint Manipulations    PT Home Exercise Plan [email or web address]    Consulted and Agree with Plan of Care Patient           Patient will benefit from skilled therapeutic intervention in order to improve the following deficits and impairments:  Abnormal gait,Decreased range of motion,Increased muscle spasms,Decreased activity tolerance,Pain,Decreased balance,Impaired flexibility,Decreased strength,Decreased mobility,Increased edema  Visit Diagnosis: Pain in left ankle and joints of left foot     Problem List Patient Active  Problem List   Diagnosis Date Noted  . High ankle sprain, left, initial encounter 05/29/2020  . Chiari I malformation ([facility] 2016    ADaleen BoPT, DPT 08/04/20 12:16 PM   [id number] [address]Phone: [phone]  Fax:  [phone] Name: DHaidar MuseMRN: [id number] of Birth: [birth year]

## 2020-08-05 LAB — COMPREHENSIVE METABOLIC PANEL
ALT: 44 U/L (ref 0–53)
AST: 63 U/L — ABNORMAL HIGH (ref 0–37)
Albumin: 4.4 g/dL (ref 3.5–5.2)
Alkaline Phosphatase: 54 U/L (ref 39–117)
BUN: 16 mg/dL (ref 6–23)
CO2: 26 mEq/L (ref 19–32)
Calcium: 8.8 mg/dL (ref 8.4–10.5)
Chloride: 104 mEq/L (ref 96–112)
Creatinine, Ser: 0.93 mg/dL (ref 0.40–1.50)
GFR: 114.57 mL/min (ref >60.00)
Glucose, Bld: 135 mg/dL — ABNORMAL HIGH (ref 70–99)
Potassium: 3.9 mEq/L (ref 3.5–5.1)
Sodium: 138 mEq/L (ref 135–145)
Total Bilirubin: 0.2 mg/dL (ref 0.2–1.2)
Total Protein: 6.5 g/dL (ref 6.0–8.3)

## 2020-08-05 LAB — RPR: RPR Ser Ql: NONREACTIVE

## 2020-08-05 LAB — HIV ANTIBODY (ROUTINE TESTING W REFLEX): HIV 1&2 Ab, 4th Generation: NONREACTIVE

## 2020-08-06 ENCOUNTER — Telehealth: Payer: Self-pay

## 2020-08-06 LAB — URINE CYTOLOGY ANCILLARY ONLY
Chlamydia: NEGATIVE
Comment: NEGATIVE
Comment: NEGATIVE
Comment: NORMAL
Neisseria Gonorrhea: NEGATIVE
Trichomonas: NEGATIVE

## 2020-08-06 NOTE — Telephone Encounter (Signed)
Called and spoke with patient. Dr Durene Cal is okay with him coming in to be retested. We will schedule him a lab appointment when he calls back

## 2020-08-06 NOTE — Telephone Encounter (Signed)
Lvm asking if patient would like to be worked in with Dr.Kim virtually.

## 2020-08-06 NOTE — Telephone Encounter (Signed)
Patient was transferred to the office from the triage line, and was wanting to schedule an appointment. I scheduled patient with Alyssa on Monday as Dr.Hunter has no availability, Onalee Hua agreed. Once I received the triage note I called patient as it stated in the note he has been having coughing and a runny nose, asked patient if he has been tested for covid recently and he said no as he said those symptoms were a one time thing and believes it was caused from the STD.   Onalee Hua and I were talking about his appointment on Monday, when he stated that he would like to get in with his doctor not someone who will "take blood pressure and screen for more STD's". Explained that I only had availability with Hosie Poisson interrupted me and stated he wanted to get to the bottom of his issues sooner rather than later.I then told him I would send a message back to Dr.Hunter to see if we could try to work him in with another provider sooner than Monday.

## 2020-08-06 NOTE — Telephone Encounter (Signed)
Nurse Assessment Nurse: Lily Kocher, RN, Adriana Date/Time (Eastern Time): 08/06/2020 2:25:25 PM Confirm and document reason for call. If symptomatic, describe symptoms. ---pt states that he tested + for chlamydia about 1 month ago (feb) (injection & pills). was treated, had sex with his partner again and then got re infected. tested (-) 3/8. tested (-) again 3/18. partner tested (+) again on 3/13. pt reports diarrhea, "stomach feels messed up", intermittent throat and chest pain, also reports cough, runny nose. itching to penis. Does the patient have any new or worsening symptoms? ---Yes Will a triage be completed? ---Yes Related visit to physician within the last 2 weeks? ---No Does the PT have any chronic conditions? (i.e. diabetes, asthma, this includes High risk factors for pregnancy, etc.) ---No Is this a behavioral health or substance abuse call? ---No Guidelines Guideline Title Affirmed Question Affirmed Notes Nurse Date/Time (Eastern Time) COVID-19 - Diagnosed or Suspected [1] COVID-19 diagnosed by doctor (or NP/PA) AND [2] mild symptoms (e.g., cough, fever, Lily Kocher, RN, Adriana 08/06/2020 2:36:06 PM PLEASE NOTE: All timestamps contained within this report are represented as [place] Standard Time. CONFIDENTIALTY NOTICE: This fax transmission is intended only for the addressee. It contains information that is legally privileged, confidential or otherwise protected from use or disclosure. If you are not the intended recipient, you are strictly prohibited from reviewing, disclosing, copying using or disseminating any of this information or taking any action in reliance on or regarding this information. If you have received this fax in error, please notify us immediately by telephone so that we can arrange for its return to [place]. Phone: [phone], Toll-Free: [phone], Fax: [phone] Page: 2 of 2 Call Id: [id number] Guidelines Guideline Title Affirmed Question Affirmed Notes Nurse  Date/Time Jeffrey Frost Time) others) AND [3] no complications or SOB Penis and Scrotum Symptoms Patient is worried they have a sexually transmitted infection (STI) Lily Kocher, RN, Adriana 08/06/2020 2:43:19 PM Disp. Time Jeffrey Frost Time) Disposition Final User 08/06/2020 2:24:08 PM Send to Urgent Danella Penton 08/06/2020 2:42:31 PM Home Care Lily Kocher, RN, Jeffrey Frost 08/06/2020 2:45:23 PM SEE PCP WITHIN 3 DAYS Yes Lily Kocher, RN, Jeffrey Frost Caller Disagree/Comply Comply Caller Understands Yes PreDisposition Call Doctor Care Advice Given Per Guideline HOME CARE: * You should be able to treat this at home. CALL BACK IF: * Chest pain or difficulty breathing occurs * You become worse CARE ADVICE given per COVID-19 - DIAGNOSED OR SUSPECTED (Adult) guideline. SEE PCP WITHIN 3 DAYS: * You need to be seen within 2 or 3 days. CALL BACK IF: * Fever or pain occur * You become worse CARE ADVICE given per Penis and Scrotum Symptoms (Adult) guideline. * PCP VISIT: Call your doctor (or NP/PA) during regular office hours and make an appointment. A clinic or urgent care center are good places to go for care if your doctor's office is closed or you can't get an appointment. NOTE: If office will be open tomorrow, tell caller to call then, not in 3 days

## 2020-08-06 NOTE — Telephone Encounter (Signed)
Pt prefers to be seen in person so he can be tested for STDs.

## 2020-08-09 NOTE — Progress Notes (Signed)
Phone [phone] In person visit   Subjective:   Jeffrey Frost is a 25 y.o. year old very pleasant male patient who presents for/with See problem oriented charting Chief Complaint  Patient presents with  . Exposure to STD    STD screening  . Health    Patient states that he wants to discuss his overall health today. He states that he has had decompression surgery on his brain and after his last injury to his ankle he feels as if his overall health isn't where it should be any more.     This visit occurred during the SARS-CoV-2 public health emergency.  Safety protocols were in place, including screening questions prior to the visit, additional usage of staff PPE, and extensive cleaning of exam room while observing appropriate contact time as indicated for disinfecting solutions.   Past Medical History-  Patient Active Problem List   Diagnosis Date Noted  . High ankle sprain, left, initial encounter 05/29/2020  . Chiari I malformation (HCC) 2016   Medications- reviewed and updated Current Outpatient Medications  Medication Sig Dispense Refill  . Ascorbic Acid (VITAMIN C) 500 MG CAPS Take 1 capsule by mouth as needed.     No current facility-administered medications for this visit.     Objective:  BP 114/78   Pulse 71   Temp 98.2 F (36.8 C) (Temporal)   Ht 6' (1.829 m)   Wt 185 lb (83.9 kg)   SpO2 97%   BMI 25.09 kg/m  Gen: NAD, resting comfortably, well-appearing CV: RRR no murmurs rubs or gallops Lungs: CTAB no crackles, wheeze, rhonchi Abdomen: soft/nontender/nondistended/normal bowel sounds. No rebound or guarding.  Ext: no edema Skin: warm, dry    Assessment and Plan  STD discussion S: Patient has been exposed to chlamydia previously-was treated on 07/10/2020 with azithromycin and Rocephin.  He did test positive for chlamydia on 07/10/2020 Doxycycline was not an option due to him taking isotretinoin.  He was to abstain 7 days from intercourse and wants to always  use condoms. GF was treated with doxycycline.   Patient was screened on 07/31/2020 for gonorrhea/chlamydia/trichomonas/HIV/syphilis-testing came back normal/negative. GF told him on 17th that she had tested positive and treated again. They had unprotected sex before she told him.   No symptoms in the last 10 days.  A/P: Patient exposed to chlamydia and tested positive in February-was treated at that time with Rocephin and azithromycin.  Unfortunately he was exposed again on March 17-was tested on 18th negative but we discussed today can take 2 weeks to develop a positive test.  Since he is asymptomatic today we wanted to wait until Thursday or Friday for recheck-we will schedule lab visit at that time. -Could be treated with doxycycline if needed instead of azithromycin as has been off Accutane for a month now per his report  #Numbness/tingling in extremities #Weight variability #Generalized weakness S: numbness in toes great toes and then working to other areas and now has numbness/tingling sensation now up to his quads  And then up into his arms. Worse with laying down- only in extremities.  He states has had symptoms like this before brain surgery and was better after brain surgery. Not having pain.   MRI with neurosurgery since symptoms started within last month. Has a visit with Dr. Lucia Gaskins this afternoon at 1:30 PM for his concerns.   He also feels a generalized weakness. He feels like his hunger is lower. Feels not lifting for as long but can lift similar  weights to start. Had a few days of stomach upset but better now.   Got down to 169 within 4 weeks but now back up to 185- more conscious about his eating  Patient also had some LFT elevation-Off isotretinoin for 4 weeks-numbers have been improving A/P: For numbness/tingling in extremities patient already scheduled a neurology visit later today.  Reports reassuring MRI in regards to prior Chiari I malformation surgery within the last month   -we do not have records of we will request today - TSH normal in December though could be repeated. B12 never checked but does not have clear reason for this to be low.  -We discussed potentially doing labs but since he is seeing neurology today and they may do more extensive testing we opted out  #Weight variability -patient was concerned about weight loss program as low as 169-I do not think there is a pathological issue since weight has been easily regained as he has been more conscious about his eating. -He had some abdominal pain within the last week-we discussed potential GI effects of marijuana and would abstain from this-he will consider  #LFT elevation-improving on last check-Accutane could have been a trigger  Recommended follow up: As needed for acute concerns Future Appointments  Date Time Provider Department Center  08/10/2020  1:30 PM Anson Fret, MD [facility] None  08/13/2020  4:00 PM [facility]  06/15/2021  4:00 PM Shelva Majestic, MD [facility]    Lab/Order associations:   ICD-10-CM   1. Numbness and tingling  R20.0    R20.2   2. Exposure to chlamydia  Z20.2 Urine cytology ancillary only    No orders of the defined types were placed in this encounter.   Time Spent: 26 minutes of total time (10:50AM- 11:16 AM) was spent on the date of the encounter performing the following actions: chart review prior to seeing the patient, obtaining history, performing a medically necessary exam, counseling on the treatment plan, placing orders, and documenting in our EHR.   Return precautions advised.  Tana Conch, MD

## 2020-08-09 NOTE — Patient Instructions (Addendum)
Sign release of information at the check out desk for most recent MRI report from [facility]  Schedule a visit on Thursday or friday afternoon for Repeat chlamydia testing. Remember do not pee an hour before doing test. Do not clean your penis before you pee into the cup.   Recommended follow up: as needed for new or worsening symptoms  We opted not to do blood work today since seeing neurology this afternoon and they will likely do more extensive blood work.

## 2020-08-10 ENCOUNTER — Encounter: Payer: Self-pay | Admitting: Family Medicine

## 2020-08-10 ENCOUNTER — Other Ambulatory Visit: Payer: Self-pay

## 2020-08-10 ENCOUNTER — Encounter: Payer: Self-pay | Admitting: Neurology

## 2020-08-10 ENCOUNTER — Telehealth: Payer: Self-pay | Admitting: Neurology

## 2020-08-10 ENCOUNTER — Ambulatory Visit (INDEPENDENT_AMBULATORY_CARE_PROVIDER_SITE_OTHER): Payer: Worker's Compensation | Admitting: Physical Therapy

## 2020-08-10 ENCOUNTER — Encounter: Payer: Self-pay | Admitting: Physical Therapy

## 2020-08-10 ENCOUNTER — Ambulatory Visit: Payer: No Typology Code available for payment source | Admitting: Family Medicine

## 2020-08-10 ENCOUNTER — Ambulatory Visit: Payer: No Typology Code available for payment source | Admitting: Physician Assistant

## 2020-08-10 ENCOUNTER — Ambulatory Visit (INDEPENDENT_AMBULATORY_CARE_PROVIDER_SITE_OTHER): Payer: No Typology Code available for payment source | Admitting: Neurology

## 2020-08-10 VITALS — BP 114/78 | HR 71 | Temp 98.2°F | Ht 72.0 in | Wt 185.0 lb

## 2020-08-10 VITALS — BP 113/59 | HR 79 | Ht 71.5 in | Wt 185.0 lb

## 2020-08-10 DIAGNOSIS — R202 Paresthesia of skin: Secondary | ICD-10-CM | POA: Diagnosis not present

## 2020-08-10 DIAGNOSIS — M25572 Pain in left ankle and joints of left foot: Secondary | ICD-10-CM

## 2020-08-10 DIAGNOSIS — R200 Anesthesia of skin: Secondary | ICD-10-CM | POA: Diagnosis not present

## 2020-08-10 DIAGNOSIS — Z202 Contact with and (suspected) exposure to infections with a predominantly sexual mode of transmission: Secondary | ICD-10-CM | POA: Diagnosis not present

## 2020-08-10 NOTE — Telephone Encounter (Signed)
Pt stated he did not want to schedule ncv/emg until hearing back from his bloodwork.

## 2020-08-10 NOTE — Telephone Encounter (Signed)
Jeffrey Frost: Patient states he had an MRI of the brain and cervical spine completed 4 weeks ago.  I reviewed all the records he brought with me, I do not see anything about an MRI of the brain and cervical spine 4 weeks ago.  He has an EMG nerve conduction study scheduled, would you ask him to please bring the MRIs that he had recently completed on disc with him so I can review.  Thanks

## 2020-08-10 NOTE — Progress Notes (Signed)
[facility]    Provider:  Dr Jaynee Eagles Requesting Provider: Gregor Hams, MD Primary Care Provider:  Marin Olp, MD  CC:  Numbness in the legs and arms  HPI:  Jeffrey Frost is a 25 y.o. male here as requested by Gregor Hams, MD for new symptoms of paresthesias,  smells heavily of marijuana   .  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  He hurt his ankle on the 13th of January, couldn;t put any pressure on it, started PT and he noticed a week after his big toes would get numb the whole big toe then it started lingering, then from there it worked its way out to all the toes on both feet pins and needles and numbness, right now his feet about numb and hands are both numb. He is numb up the knee and the hands.The hands are worse with laying down and he felt his arms were numb. He feels the hands are related and it keeps on increasing. His balance is impaired. He went to get out of his chair and he normaly has pretty good balance and he almost stumbled, more his equilibrium. This morning he didn't feel he had feeling in his hands. Certain positions will make his entire arm or leg go numb and moving helps. Slowly progressive. Comes and goes through out the day. He likes to work out at [employer], but notices it more with little things like more frail and weak and shaky. He has lost 30 pounds recently over a few month. Symmetrical symptoms. No other focal neurologic deficits, associated symptoms, inciting events or modifiable factors.  Patient reports he had stable MRI of the brain and cervical spine 4 weeks ago.  Per records, MRI scan of the brain in February 2014 showed 9 mm Chiari malformation with crowding of the  craniocervical junction, mild to moderate ventriculomegaly, MRI of the cervical spine in March 2014 redemonstrated is Chiari malformation with no evidence of syringomyelia.  Reviewed notes, labs and imaging from outside physicians, which showed:   I reviewed notes from neurosurgery from May 2014 through 2017, patient had a long history of headaches dating back to when he was 39-30 years of age, occurring up to 4 times a week, bitemporal, aching and throbbing, with increase severity when he coughs or sneezes, he was seen by neurologist who tried sumatriptan and Topamax, MRI scan showed a significant Chiari malformation for which he was referred to neurosurgery [facility]) in May 2014.  He also has a history of multiple ear infections, asthma and migraines.  Continue to follow him for several years, neuropsych testing in December 2018 showed low average to average intellectual reasoning with average verbal and nonverbal reasoning, working memory no low average psychomotor speed, there was concerned about his psychiatric status and the possibility of his recent medication induced psychosis under thing in underlying mental health challenge and psychiatry evaluation was recommended.  He did undergo Chiari decompression in July 2017 with resolution of his occipital headaches, improved swallowing challenges, unremarkable neurologic examination.  He did have some auditory hallucinations and psychiatric challenges, felt to be purely medication induced because they were improved off of all medications.  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  Review of Systems: Patient complains of symptoms per HPI as well as the following symptoms: numbnaess. Pertinent negatives and positives per HPI. All others negative.   Social History   Socioeconomic History  . Marital status: Single    Spouse name: Not  on file  . Number of children: Not on file  . Years of education: Not on file  . Highest education level: Not on file  Occupational History  . Occupation: Civil engineer, contracting: OTHER    Comment: [employer]  Tobacco Use  . Smoking status: Former Research scientist (life sciences)  . Smokeless tobacco: Never Used  Vaping Use  . Vaping Use: Former  Substance and Sexual Activity  . Alcohol use: Yes    Comment: social drinker- maybe one day on weekend about 5   . Drug use: Never  . Sexual activity: Yes    Birth control/protection: Condom  Other Topics Concern  . Not on file  Social History Narrative   Single. Lives with a roommate. Has 1 dog- half german shepherd and half [place] inu      Works at [employer] as Therapist, occupational.    He is interested in Adult nurse estate and Industrial/product designer.       Hobbies: working out, time with friends, family, travel.    Social Determinants of Health   Financial Resource Strain: Not on file  Food Insecurity: Not on file  Transportation Needs: Not on file  Physical Activity: Not on file  Stress: Not on file  Social Connections: Not on file  Intimate Partner Violence: Not on file    Family History  Problem Relation Age of Onset  . Crohn's disease Mother   . Hypertension Father        not sure which it is HTN or HLD  . Hyperlipidemia Father        not close to father  . Hyperlipidemia Maternal Grandmother   . Other Maternal Grandfather        sepsis. and may have had heart related issues  . Other Paternal Grandmother        thinks pacemaker- limited relationship  . Other Paternal Grandfather        limited relationship    Past Medical History:  Diagnosis Date  . Asthma    as a child   . 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 Doy Mince In [place]. still does appointments there.     Patient Active Problem List   Diagnosis Date Noted  . High ankle sprain, left, initial encounter 05/29/2020  . Chiari I malformation  ([place]) 2016    Past Surgical History:  Procedure Laterality Date  . ADENOIDECTOMY    . Chiari decompression  11/24/2015  . TONSILLECTOMY      Current Outpatient Medications  Medication Sig Dispense Refill  . Ascorbic Acid (VITAMIN C) 500 MG CAPS Take 1 capsule by mouth as needed.     No current facility-administered medications for this visit.    Allergies as of 08/10/2020 - Review Complete 08/10/2020  Allergen Reaction Noted  . Amitriptyline  06/02/2019  . Gabapentin  10/27/2019    Vitals: BP (!) 113/59   Pulse 79   Ht 5' 11.5" (1.816 m)   Wt 185 lb (83.9 kg)   BMI 25.44 kg/m  Last Weight:  Wt Readings from Last 1 Encounters:  08/10/20 185 lb (83.9 kg)   Last Height:   Ht Readings from Last 1 Encounters:  08/10/20 5' 11.5" (1.816 m)     Physical exam: Exam: Gen: NAD, conversant, smells heavily of marijuana                   CV: RRR, no MRG. No Carotid Bruits. No peripheral edema, warm, nontender Eyes: Conjunctivae clear without exudates or hemorrhage hyperhidrosis of the hands and feet  Neuro: Detailed Neurologic Exam  Speech:    Speech is normal; fluent and spontaneous with normal comprehension.  Cognition:    The patient is oriented to person, place, and time;     recent and remote memory intact;     language fluent;     normal attention, concentration,     fund of knowledge Cranial Nerves:    The pupils are equal, round, and reactive to light. The fundi are normal and spontaneous venous pulsations are present. Visual fields are full to finger confrontation. Extraocular movements are intact. Trigeminal sensation is intact and the muscles of mastication are normal. The face is symmetric. The palate elevates in the midline. Hearing intact. Voice is normal. Shoulder shrug is normal. The tongue has normal motion without fasciculations.   Coordination:    Normal finger to nose and heel to shin.   Gait:    Heel-toe and tandem gait are normal.   Motor  Observation:    No asymmetry, no atrophy, and no involuntary movements noted. Tone:    Normal muscle tone.    Posture:    Posture is normal. normal erect    Strength:    Strength is V/V in the upper and lower limbs.      Sensation: intact to LT, pin prick, vibration, cold, proprioception     Reflex Exam:  DTR's:    Deep tendon reflexes in the upper and lower extremities are normal bilaterally.   Toes:    The toes are downgoing bilaterally.   Clonus:    Clonus is absent.    Assessment/Plan:  25 y.o. male here as requested by Gregor Hams, MD for new symptoms of paresthesias,  smells heavily of marijuana.  States since January he has developed numbness in his legs to his knees and numbness in his hands that radiate up to his shoulders.  Neurologic exam is completely normal including sensation and reflexes.  He does have a history of Chiari malformation with decompression and states that he had repeat imaging just 4 weeks ago of his brain and cervical spine which were stable.  I do not have the most recent imaging however I reviewed his neurosurgical records from 2014 through 2018, he had decompression in 2017, MRI of the cervical spine did not show syrinx, MRI of thoracic and lumbar spines were unremarkable.  Sensation is completely intact including distal pinprick, temperature, vibration, proprioception. Reflexes normal. He smells heavily of marijuana and he has hyperhidrosis of the hands and feet and I am not sure there is any association.  I asked him to stop smoking the marijuana, I am not to positive that will happen but I did explain I cannot rule out that his marijuana use is not somehow contributory.  We will test him with blood work today, and perform an EMG nerve conduction study.   Orders Placed This Encounter  Procedures  . Hemoglobin A1c  . Vitamin B1  . Vitamin B6  . Copper, serum  . Methylmalonic acid, serum  . Angiotensin converting  enzyme  . Sedimentation rate  .  Sjogren's syndrome antibods(ssa + ssb)  . B12 and Folate Panel  . Heavy metals, blood  . Vitamin E  . Multiple Myeloma Panel (SPEP&IFE w/QIG)  . ANA, IFA (with reflex)  . CBC with Differential/Platelets  . TSH  . CK  . NCV with EMG(electromyography)   No orders of the defined types were placed in this encounter.   Cc: Gregor Hams, MD,  Marin Olp, MD  Sarina Ill, MD  [facility] [address] [place], [place] [address]  Phone [phone] Fax [phone]

## 2020-08-10 NOTE — Therapy (Signed)
[facility] [address] [place], [place], [address] Phone: [phone]   Fax:  [phone]  Physical Therapy Discharge  Patient Details  Name: Jeffrey Frost MRN: [id number] Date of Birth: [birth year] Referring Provider (PT): Lynne Leader   Encounter Date: 08/10/2020   PT End of Session - 08/10/20 1239    Visit Number 11    Number of Visits 12    Date for PT Re-Evaluation 07/21/20    Authorization Type [employer] [place]    PT Start Time 0935    PT Stop Time 1015    PT Time Calculation (min) 40 min    Activity Tolerance Patient tolerated treatment well    Behavior During Therapy [facility] for tasks assessed/performed           Past Medical History:  Diagnosis Date  . Asthma    as a child   . 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 Doy Mince In [place]. still does appointments there.     Past Surgical History:  Procedure Laterality Date  . ADENOIDECTOMY    . Chiari decompression  11/24/2015  . TONSILLECTOMY      There were no vitals filed for this visit.   Subjective Assessment - 08/10/20 1236    Subjective Pt reports that the ankle is continuing to do better. He feels that reducing the frequency and volume of loading since last session has helped. He still does have some pain with within the heel anterior to Achilles that will "shoot to the front." Pt reports increased NT into all extremities that has moved more proximally. Pt reports moment of decreased balance at work and decreased coordination with walking.    Pertinent History 2017 brain surgery for chiari malformation    Limitations Lifting;Standing;Walking;House hold activities    Patient Stated Goals Decreased pain, return to work    Currently in Pain? No/denies    Pain Score 0-No pain    Pain Orientation Left    Pain Onset 1 to 4 weeks ago              [facility] PT Assessment - 08/10/20 0001      Assessment   Medical Diagnosis L ankle  pain/sprain    Referring Provider (PT) Lynne Leader    Onset Date/Surgical Date 05/28/20    Prior Therapy no      Restrictions   Weight Bearing Restrictions No      Balance Screen   Has the patient fallen in the past 6 months Yes    How many times? 1    Has the patient had a decrease in activity level because of a fear of falling?  No    Is the patient reluctant to leave their home because of a fear of falling?  No      Prior Function   Level of Independence Independent      Cognition   Overall Cognitive Status Within Functional Limits for tasks assessed      Coordination   Gross Motor Movements are Fluid and Coordinated Yes    Finger Nose Finger Test WNL   (-) Hoffman's (-) Rapid Alt Movement     Functional Tests   Functional tests Lunges;Squat      Squat   Comments WNL      Lunges   Comments WNL      AROM   Overall AROM Comments L ankle ROM: WNL no pain after STM      Strength  Overall Strength Comments DF: 5/5 PF: 5/5 IV: 5/5 EV: 5/5      Palpation   Palpation comment no pain with palpation with anterior drawer or talar tilt      Special Tests   Other special tests SLS: able to perform >30s on pliant surface, able to perform SLS squatting with minor pain into ant ankle and Achilles      High Level Balance   High Level Balance Activites Direction changes;Sudden stops    High Level Balance Comments able to perform SL hopping with good AP and ML control, DL broad jumping, and good ankle stability with SL squatting                         [facility] Adult PT Treatment/Exercise - 08/10/20 0001      Ambulation/Gait   Gait Comments Toe out position on L, tendency to favor IV/supination with midstance      Exercises   Exercises Ankle      Manual Therapy   Manual Therapy Joint mobilization;Soft tissue mobilization    Joint Mobilization L post TC glide, grade III    Soft tissue mobilization L gastroc soleus, and ML Achilles                   PT Education - 08/10/20 1238    Education Details HEP, recovery, strength training, self exercise progression, self management of sx, D/C of care, MS symptoms and presentation, resources for future care   Person(s) Educated Patient    Methods Explanation;Demonstration    Comprehension Verbalized understanding;Returned demonstration            PT Short Term Goals - 08/10/20 1244      PT SHORT TERM GOAL #1   Title Pt to be independent with initial HEP    Time 2    Period Weeks    Status Achieved    Target Date 06/23/20             PT Long Term Goals - 08/10/20 1245      PT LONG TERM GOAL #1   Title Pt to be independent wtih final HEP    Time 6    Period Weeks    Status Achieved      PT LONG TERM GOAL #2   Title Pt to demo strength of L ankle to be at least 4+/5 to improve stability and gait    Time 6    Period Weeks    Status Achieved      PT LONG TERM GOAL #3   Title Pt to report decreased pain in ankle to 0-/10 with standing, walking, and dynamic activity, for ability to return to work.    Time 6    Period Weeks    Status Achieved      PT LONG TERM GOAL #4   Title Pt to demo stability of L ankle to be WNL for single leg and dynamic movements for safe return to work.    Time 6    Period Weeks    Status Achieved                 Plan - 08/10/20 1240    Clinical Impression Statement Pt's posterior pinching pain with PF was relieved with STM and joint mobilization at today's session. Pt possibly developing os trigonum following ankle sprain but does not present with or report increase in redness/swelling. Pt is able to perform sustained SL jumping,  DL jumping/leaping, as well as good SL stability with functional movement screening. Pt has returned to work at this time. Pt has met all goals for PT and has no further need for skilled therapy. D/C this episode at this time. Pt gave verbal understanding to POC, edu re DC, and self management/progression techniques.  Pt gave verbal understanding to possibility of os trigonum and to contact MD in case of worsening sx. Pt to follow up woth MD re neurological onset of sx.    Examination-Activity Limitations Locomotion Level;Carry;Squat;Stairs;Stand;Lift    Examination-Participation Restrictions Occupation;Cleaning;Community Activity;Driving;Meal Prep    Stability/Clinical Decision Making Stable/Uncomplicated    Rehab Potential Good    PT Frequency 2x / week    PT Duration 6 weeks    PT Treatment/Interventions ADLs/Self Care Home Management;Canalith Repostioning;Cryotherapy;Scientist, product/process development;Iontophoresis 40m/ml Dexamethasone;Moist Heat;Traction;Ultrasound;Balance training;Therapeutic exercise;Therapeutic activities;Functional mobility training;Stair training;Gait training;Cognitive remediation;Patient/family education;Orthotic Fit/Training;Passive range of motion;Manual techniques;Dry needling;Splinting;Taping;Vasopneumatic Device;Spinal Manipulations;Joint Manipulations    PT Home Exercise Plan [id number]    Consulted and Agree with Plan of Care Patient           Patient will benefit from skilled therapeutic intervention in order to improve the following deficits and impairments:  Abnormal gait,Decreased range of motion,Increased muscle spasms,Decreased activity tolerance,Pain,Decreased balance,Impaired flexibility,Decreased strength,Decreased mobility,Increased edema  Visit Diagnosis: Pain in left ankle and joints of left foot     Problem List Patient Active Problem List   Diagnosis Date Noted  . High ankle sprain, left, initial encounter 05/29/2020  . Chiari I malformation ([facility] 2016   ADaleen BoPT, DPT 08/10/20 12:51 PM   [place] [address]Phone: [phone]  Fax:  [phone] Name: Jeffrey SanonMRN: [id number] of Birth: 411-03-1996  PHYSICAL THERAPY DISCHARGE SUMMARY  Visits from  Start of Care: 11     Remaining deficits: slight pain with PF, improved with STM and pt understands self management     Plan: Patient agrees to discharge.  Patient goals were met. Patient is being discharged due to meeting the stated rehab goals.  ?????

## 2020-08-10 NOTE — Patient Instructions (Addendum)
EMG/NCS MRI of the brain and cervical spine? Blood work    Elige Radon and Daroff's neurology in clinical practice (8th ed., pp. 3007- 1929). Elsevier."> Goldman-Cecil medicine (26th ed., pp. 2489- 2501). Elsevier.">  Peripheral Neuropathy Peripheral neuropathy is a type of nerve damage. It affects nerves that carry signals between the spinal cord and the arms, legs, and the rest of the body (peripheral nerves). It does not affect nerves in the spinal cord or brain. In peripheral neuropathy, one nerve or a group of nerves may be damaged. Peripheral neuropathy is a broad category that includes many specific nerve disorders, like diabetic neuropathy, hereditary neuropathy, and carpal tunnel syndrome. What are the causes? This condition may be caused by:  Diabetes. This is the most common cause of peripheral neuropathy.  Nerve injury.  Pressure or stress on a nerve that lasts a long time.  Lack (deficiency) of B vitamins. This can result from alcoholism, poor diet, or a restricted diet.  Infections.  Autoimmune diseases, such as rheumatoid arthritis and systemic lupus erythematosus.  Nerve diseases that are passed from parent to child (inherited).  Some medicines, such as cancer medicines (chemotherapy).  Poisonous (toxic) substances, such as lead and mercury.  Too little blood flowing to the legs.  Kidney disease.  Thyroid disease. In some cases, the cause of this condition is not known. What are the signs or symptoms? Symptoms of this condition depend on which of your nerves is damaged. Common symptoms include:  Loss of feeling (numbness) in the feet, hands, or both.  Tingling in the feet, hands, or both.  Burning pain.  Very sensitive skin.  Weakness.  Not being able to move a part of the body (paralysis).  Muscle twitching.  Clumsiness or poor coordination.  Loss of balance.  Not being able to control your bladder.  Feeling dizzy.  Sexual problems. How is  this diagnosed? Diagnosing and finding the cause of peripheral neuropathy can be difficult. Your health care provider will take your medical history and do a physical exam. A neurological exam will also be done. This involves checking things that are affected by your brain, spinal cord, and nerves (nervous system). For example, your health care provider will check your reflexes, how you move, and what you can feel. You may have other tests, such as:  Blood tests.  Electromyogram (EMG) and nerve conduction tests. These tests check nerve function and how well the nerves are controlling the muscles.  Imaging tests, such as CT scans or MRI to rule out other causes of your symptoms.  Removing a small piece of nerve to be examined in a lab (nerve biopsy).  Removing and examining a small amount of the fluid that surrounds the brain and spinal cord (lumbar puncture). How is this treated? Treatment for this condition may involve:  Treating the underlying cause of the neuropathy, such as diabetes, kidney disease, or vitamin deficiencies.  Stopping medicines that can cause neuropathy, such as chemotherapy.  Medicine to help relieve pain. Medicines may include: ? Prescription or over-the-counter pain medicine. ? Antiseizure medicine. ? Antidepressants. ? Pain-relieving patches that are applied to painful areas of skin.  Surgery to relieve pressure on a nerve or to destroy a nerve that is causing pain.  Physical therapy to help improve movement and balance.  Devices to help you move around (assistive devices). Follow these instructions at home: Medicines  Take over-the-counter and prescription medicines only as told by your health care provider. Do not take any other medicines without  first asking your health care provider.  Do not drive or use heavy machinery while taking prescription pain medicine. Lifestyle  Do not use any products that contain nicotine or tobacco, such as cigarettes and  e-cigarettes. Smoking keeps blood from reaching damaged nerves. If you need help quitting, ask your health care provider.  Avoid or limit alcohol. Too much alcohol can cause a vitamin B deficiency, and vitamin B is needed for healthy nerves.  Eat a healthy diet. This includes: ? Eating foods that are high in fiber, such as fresh fruits and vegetables, whole grains, and beans. ? Limiting foods that are high in fat and processed sugars, such as fried or sweet foods.   General instructions  If you have diabetes, work closely with your health care provider to keep your blood sugar under control.  If you have numbness in your feet: ? Check every day for signs of injury or infection. Watch for redness, warmth, and swelling. ? Wear padded socks and comfortable shoes. These help protect your feet.  Develop a good support system. Living with peripheral neuropathy can be stressful. Consider talking with a mental health specialist or joining a support group.  Use assistive devices and attend physical therapy as told by your health care provider. This may include using a walker or a cane.  Keep all follow-up visits as told by your health care provider. This is important.   Contact a health care provider if:  You have new signs or symptoms of peripheral neuropathy.  You are struggling emotionally from dealing with peripheral neuropathy.  Your pain is not well-controlled. Get help right away if:  You have an injury or infection that is not healing normally.  You develop new weakness in an arm or leg.  You have fallen or do so frequently. Summary  Peripheral neuropathy is when the nerves in the arms, or legs are damaged, resulting in numbness, weakness, or pain.  There are many causes of peripheral neuropathy, including diabetes, pinched nerves, vitamin deficiencies, autoimmune disease, and hereditary conditions.  Diagnosing and finding the cause of peripheral neuropathy can be difficult.  Your health care provider will take your medical history, do a physical exam, and do tests, including blood tests and nerve function tests.  Treatment involves treating the underlying cause of the neuropathy and taking medicines to help control pain. Physical therapy and assistive devices may also help. This information is not intended to replace advice given to you by your health care provider. Make sure you discuss any questions you have with your health care provider. Document Revised: 02/11/2020 Document Reviewed: 02/11/2020 Elsevier Patient Education  2021 Elsevier Inc.   Electromyoneurogram Electromyoneurogram is a test to check how well your muscles and nerves are working. This procedure includes the combined use of electromyogram (EMG) and nerve conduction study (NCS). EMG is used to look for muscular disorders. NCS, which is also called electroneurogram, measures how well your nerves are controlling your muscles. The procedures are usually done together to check if your muscles and nerves are healthy. If the results of the tests are abnormal, this may indicate disease or injury, such as a neuromuscular disease or peripheral nerve damage. Tell a health care provider about:  Any allergies you have.  All medicines you are taking, including vitamins, herbs, eye drops, creams, and over-the-counter medicines.  Any problems you or family members have had with anesthetic medicines.  Any blood disorders you have.  Any surgeries you have had.  Any medical conditions you  have.  If you have a pacemaker.  Whether you are pregnant or may be pregnant. What are the risks? Generally, this is a safe procedure. However, problems may occur, including:  Infection where the electrodes were inserted.  Bleeding. What happens before the procedure? Medicines Ask your health care provider about:  Changing or stopping your regular medicines. This is especially important if you are taking diabetes  medicines or blood thinners.  Taking medicines such as aspirin and ibuprofen. These medicines can thin your blood. Do not take these medicines unless your health care provider tells you to take them.  Taking over-the-counter medicines, vitamins, herbs, and supplements. General instructions  Your health care provider may ask you to avoid: ? Beverages that have caffeine, such as coffee and tea. ? Any products that contain nicotine or tobacco. These products include cigarettes, e-cigarettes, and chewing tobacco. If you need help quitting, ask your health care provider.  Do not use lotions or creams on the same day that you will be having the procedure. What happens during the procedure? For EMG  Your health care provider will ask you to stay in a position so that he or she can access the muscle that will be studied. You may be standing, sitting, or lying down.  You may be given a medicine that numbs the area (local anesthetic).  A very thin needle that has an electrode will be inserted into your muscle.  Another small electrode will be placed on your skin near the muscle.  Your health care provider will ask you to continue to remain still.  The electrodes will send a signal that tells about the electrical activity of your muscles. You may see this on a monitor or hear it in the room.  After your muscles have been studied at rest, your health care provider will ask you to contract or flex your muscles. The electrodes will send a signal that tells about the electrical activity of your muscles.  Your health care provider will remove the electrodes and the electrode needles when the procedure is finished. The procedure may vary among health care providers and hospitals.   For NCS  An electrode that records your nerve activity (recording electrode) will be placed on your skin by the muscle that is being studied.  An electrode that is used as a reference (reference electrode) will be placed  near the recording electrode.  A paste or gel will be applied to your skin between the recording electrode and the reference electrode.  Your nerve will be stimulated with a mild shock. Your health care provider will measure how much time it takes for your muscle to react.  Your health care provider will remove the electrodes and the gel when the procedure is finished. The procedure may vary among health care providers and hospitals.   What happens after the procedure?  It is up to you to get the results of your procedure. Ask your health care provider, or the department that is doing the procedure, when your results will be ready.  Your health care provider may: ? Give you medicines for any pain. ? Monitor the insertion sites to make sure that bleeding stops. Summary  Electromyoneurogram is a test to check how well your muscles and nerves are working.  If the results of the tests are abnormal, this may indicate disease or injury.  This is a safe procedure. However, problems may occur, such as bleeding and infection.  Your health care provider will do two  tests to complete this procedure. One checks your muscles (EMG) and another checks your nerves (NCS).  It is up to you to get the results of your procedure. Ask your health care provider, or the department that is doing the procedure, when your results will be ready. This information is not intended to replace advice given to you by your health care provider. Make sure you discuss any questions you have with your health care provider. Document Revised: 01/16/2018 Document Reviewed: 12/29/2017 Elsevier Patient Education  2021 [employer]

## 2020-08-11 LAB — SPECIMEN STATUS REPORT

## 2020-08-11 LAB — VITAMIN B6

## 2020-08-11 NOTE — Telephone Encounter (Signed)
Called patient and advised him Dr Lucia Gaskins didn't see MRI brain, cervical spine reports in his information. She needs him to bring those disks when he comes for EMG. He stated will bring. He stated EMG wasn't scheduled. I advised will have staff call him and schedule . Patient verbalized understanding, appreciation.

## 2020-08-11 NOTE — Telephone Encounter (Signed)
Pt called to schedule Nerve Conduction and EMG. Scheduled 08/20/20 at 8:30a

## 2020-08-13 ENCOUNTER — Other Ambulatory Visit: Payer: No Typology Code available for payment source

## 2020-08-14 ENCOUNTER — Other Ambulatory Visit: Payer: No Typology Code available for payment source

## 2020-08-17 ENCOUNTER — Other Ambulatory Visit: Payer: No Typology Code available for payment source

## 2020-08-17 ENCOUNTER — Other Ambulatory Visit (HOSPITAL_COMMUNITY)
Admission: RE | Admit: 2020-08-17 | Discharge: 2020-08-17 | Disposition: A | Discharge status: Home / Self Care | Payer: BLUE CROSS/BLUE SHIELD | Source: Ambulatory Visit | Attending: Family Medicine | Admitting: Family Medicine

## 2020-08-17 ENCOUNTER — Other Ambulatory Visit: Payer: Self-pay

## 2020-08-17 ENCOUNTER — Ambulatory Visit: Payer: No Typology Code available for payment source | Admitting: Physician Assistant

## 2020-08-17 DIAGNOSIS — Z202 Contact with and (suspected) exposure to infections with a predominantly sexual mode of transmission: Secondary | ICD-10-CM

## 2020-08-18 LAB — MULTIPLE MYELOMA PANEL, SERUM
Albumin SerPl Elph-Mcnc: 4.2 g/dL (ref 2.9–4.4)
Albumin/Glob SerPl: 1.6 (ref 0.7–1.7)
Alpha 1: 0.2 g/dL (ref 0.0–0.4)
Alpha2 Glob SerPl Elph-Mcnc: 0.6 g/dL (ref 0.4–1.0)
B-Globulin SerPl Elph-Mcnc: 0.9 g/dL (ref 0.7–1.3)
Gamma Glob SerPl Elph-Mcnc: 1.1 g/dL (ref 0.4–1.8)
Globulin, Total: 2.8 g/dL (ref 2.2–3.9)
IgA/Immunoglobulin A, Serum: 171 mg/dL (ref 90–386)
IgG (Immunoglobin G), Serum: 897 mg/dL (ref 603–1613)
IgM (Immunoglobulin M), Srm: 209 mg/dL — ABNORMAL HIGH (ref 20–172)
Total Protein: 7 g/dL (ref 6.0–8.5)

## 2020-08-18 LAB — CBC WITH DIFFERENTIAL/PLATELET
Basophils Absolute: 0 10*3/uL (ref 0.0–0.2)
Basos: 1 %
EOS (ABSOLUTE): 0.1 10*3/uL (ref 0.0–0.4)
Eos: 1 %
Hematocrit: 46.2 % (ref 37.5–51.0)
Hemoglobin: 15.6 g/dL (ref 13.0–17.7)
Immature Grans (Abs): 0 10*3/uL (ref 0.0–0.1)
Immature Granulocytes: 0 %
Lymphocytes Absolute: 3.6 10*3/uL — ABNORMAL HIGH (ref 0.7–3.1)
Lymphs: 45 %
MCH: 30.4 pg (ref 26.6–33.0)
MCHC: 33.8 g/dL (ref 31.5–35.7)
MCV: 90 fL (ref 79–97)
Monocytes Absolute: 0.6 10*3/uL (ref 0.1–0.9)
Monocytes: 7 %
Neutrophils Absolute: 3.7 10*3/uL (ref 1.4–7.0)
Neutrophils: 46 %
Platelets: 312 10*3/uL (ref 150–450)
RBC: 5.13 x10E6/uL (ref 4.14–5.80)
RDW: 12.9 % (ref 11.6–15.4)
WBC: 8 10*3/uL (ref 3.4–10.8)

## 2020-08-18 LAB — ANGIOTENSIN CONVERTING ENZYME: Angio Convert Enzyme: 19 U/L (ref 14–82)

## 2020-08-18 LAB — ANTINUCLEAR ANTIBODIES, IFA: ANA Titer 1: NEGATIVE

## 2020-08-18 LAB — VITAMIN E
Vitamin E (Alpha Tocopherol): 7.6 mg/L (ref 5.9–19.4)
Vitamin E(Gamma Tocopherol): 0.9 mg/L (ref 0.7–4.9)

## 2020-08-18 LAB — SEDIMENTATION RATE: Sed Rate: 3 mm/hr (ref 0–15)

## 2020-08-18 LAB — B12 AND FOLATE PANEL
Folate: 6.3 ng/mL (ref >3.0)
Vitamin B-12: 713 pg/mL (ref 232–1245)

## 2020-08-18 LAB — METHYLMALONIC ACID, SERUM: Methylmalonic Acid: 175 nmol/L (ref 0–378)

## 2020-08-18 LAB — HEAVY METALS, BLOOD
Arsenic: <1 ug/L (ref 0–9)
Lead, Blood: <1 ug/dL (ref 0–4)
Mercury: <1 ug/L (ref 0.0–14.9)

## 2020-08-18 LAB — CK: Total CK: 214 U/L (ref 49–439)

## 2020-08-18 LAB — COPPER, SERUM: Copper: 116 ug/dL (ref 63–121)

## 2020-08-18 LAB — TSH: TSH: 0.588 u[IU]/mL (ref 0.450–4.500)

## 2020-08-18 LAB — VITAMIN B1: Thiamine: 113.9 nmol/L (ref 66.5–200.0)

## 2020-08-18 LAB — SJOGREN'S SYNDROME ANTIBODS(SSA + SSB)
ENA SSA (RO) Ab: <0.2 AI (ref 0.0–0.9)
ENA SSB (LA) Ab: <0.2 AI (ref 0.0–0.9)

## 2020-08-18 LAB — HEMOGLOBIN A1C
Est. average glucose Bld gHb Est-mCnc: 100 mg/dL
Hgb A1c MFr Bld: 5.1 % (ref 4.8–5.6)

## 2020-08-19 LAB — URINE CYTOLOGY ANCILLARY ONLY
Chlamydia: NEGATIVE
Comment: NEGATIVE
Comment: NEGATIVE
Comment: NORMAL
Neisseria Gonorrhea: NEGATIVE
Trichomonas: NEGATIVE

## 2020-08-20 ENCOUNTER — Ambulatory Visit (INDEPENDENT_AMBULATORY_CARE_PROVIDER_SITE_OTHER): Payer: No Typology Code available for payment source | Admitting: Neurology

## 2020-08-20 DIAGNOSIS — R202 Paresthesia of skin: Secondary | ICD-10-CM | POA: Diagnosis not present

## 2020-08-20 DIAGNOSIS — Z0289 Encounter for other administrative examinations: Secondary | ICD-10-CM

## 2020-08-20 DIAGNOSIS — R200 Anesthesia of skin: Secondary | ICD-10-CM

## 2020-08-20 NOTE — Progress Notes (Signed)
History: Patient with multiple neurologic complaints, he discusses some new issues today, still consistent with paresthesias in the arms and legs. 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 We reviewed all his bloodwork (CK, TSH, CBC, ANA, multiple myeloma panel, vitamin D, heavy metals, B12 and folate, Sjogren's antibodies, sed rate, angiotensin-converting enzyme, methylmalonic acid, copper, vitamin B6, vitamin B1, hemoglobin A1c, RPR, HIV) which are all normal except for B6 which still has not resulted.  He appears to be a reasonable young man, I really do not know what is causing this but given his age and that he appears to be reliable, I will refer him to [facility].  We will MRI his thoracic and lumbar spine as well.  Orders Placed This Encounter  Procedures  . MR THORACIC SPINE WO CONTRAST  . MR LUMBAR SPINE WO CONTRAST  . Ambulatory referral to Neurology     I spent over 30  minutes of face-to-face and non-face-to-face time with patient on the  1. Numbness and tingling of both legs   2. Numbness in feet   3. Numbness and tingling in both hands   4. Paresthesias    diagnosis.  This included previsit chart review, lab review, study review, order entry, electronic health record documentation, patient education on the different diagnostic and therapeutic options, counseling and coordination of care, risks and benefits of management, compliance, or risk factor reduction. This dos not include time spent on emg/ncs.

## 2020-08-20 NOTE — Progress Notes (Signed)
See procedure note.

## 2020-08-20 NOTE — Progress Notes (Signed)
Full Name: Jeffrey Frost Gender: Male MRN #: [id number] Date of Birth: [birth year]    Visit Date: 08/20/2020 08:45 Age: 25 Years Examining Physician: Naomie Dean, MD  Requesting Provider: Rodolph Bong, MD Primary Care Provider:  Shelva Majestic, MD  History: Paresthesias of the hands and legs  Summary: EMG/NCS performed on the right upper and right lower extremities. All nerves and muscles (as indicated in the following tables) were within normal limits.    Conclusion: This is a normal study.   Naomie Dean, M.D.  [facility] [address] [place], [place] [address] Tel: [phone] Fax: [phone]  Verbal informed consent was obtained from the patient, patient was informed of potential risk of procedure, including bruising, bleeding, hematoma formation, infection, muscle weakness, muscle pain, numbness, among others.        MNC    Nerve / Sites Muscle Latency Ref. Amplitude Ref. Rel Amp Segments Distance Velocity Ref. Area    ms ms mV mV %  cm m/s m/s mVms  R Median - APB     Wrist APB 3.5 ?4.4 10.6 ?4.0 100 Wrist - APB 7   41.1     Upper arm APB 7.8  10.3  97.2 Upper arm - Wrist 25 58 ?49 41.4  R Ulnar - ADM     Wrist ADM 2.5 ?3.3 10.9 ?6.0 100 Wrist - ADM 7   40.7     B.Elbow ADM 6.1  10.5  95.6 B.Elbow - Wrist 20 56 ?49 39.8     A.Elbow ADM 7.9  9.1  87.2 A.Elbow - B.Elbow 10 55 ?49 37.1  R Peroneal - EDB     Ankle EDB 4.0 ?6.5 12.1 ?2.0 100 Ankle - EDB 9   51.4     Fib head EDB 10.8  11.5  95 Fib head - Ankle 32 47 ?44 50.8     Pop fossa EDB 12.9  11.5  99.6 Pop fossa - Fib head 10 47 ?44 50.7         Pop fossa - Ankle      R Tibial - AH     Ankle AH 4.0 ?5.8 16.5 ?4.0 100 Ankle - AH 9   48.9     Pop fossa AH 13.4  14.0  84.8 Pop fossa - Ankle 40 43 ?41 47.5             SNC    Nerve / Sites Rec. Site Peak Lat Ref.  Amp Ref. Segments Distance Peak Diff Ref.    ms ms V V  cm ms ms  R Sural - Ankle (Calf)     Calf Ankle 3.6  ?4.4 27 ?6 Calf - Ankle 14    R Superficial peroneal - Ankle     Lat leg Ankle 3.6 ?4.4 16 ?6 Lat leg - Ankle 14    R Median, Ulnar - Transcarpal comparison     Median Palm Wrist 2.1 ?2.2 178 ?35 Median Palm - Wrist 8       Ulnar Palm Wrist 2.1 ?2.2 13 ?12 Ulnar Palm - Wrist 8          Median Palm - Ulnar Palm  0.0 ?0.4  R Median - Orthodromic (Dig II, Mid palm)     Dig II Wrist 2.8 ?3.4 37 ?10 Dig II - Wrist 13    R Ulnar - Orthodromic, (Dig V, Mid palm)     Dig V Wrist 2.6 ?3.1  7 ?5 Dig V - Wrist 80                 F  Wave    Nerve F Lat Ref.   ms ms  R Tibial - AH 54.9 ?56.0  R Ulnar - ADM 28.2 ?32.0         EMG Summary Table    Spontaneous MUAP Recruitment  Muscle IA Fib PSW Fasc Other Amp Dur. Poly Pattern  R. Deltoid Normal None None None _______ Normal Normal Normal Normal  R. Triceps brachii Normal None None None _______ Normal Normal Normal Normal  R. Pronator teres Normal None None None _______ Normal Normal Normal Normal  R. First dorsal interosseous Normal None None None _______ Normal Normal Normal Normal  R. Opponens pollicis Normal None None None _______ Normal Normal Normal Normal  R. Vastus medialis Normal None None None _______ Normal Normal Normal Normal  R. Tibialis anterior Normal None None None _______ Normal Normal Normal Normal  R. Gastrocnemius (Medial head) Normal None None None _______ Normal Normal Normal Normal  R. Biceps femoris (long head) Normal None None None _______ Normal Normal Normal Normal  R. Gluteus maximus Normal None None None _______ Normal Normal Normal Normal  R. Gluteus medius Normal None None None _______ Normal Normal Normal Normal  R. Cervical paraspinals (low) Normal None None None _______ Normal Normal Normal Normal  R. Lumbar paraspinals (low) Normal None None None _______ Normal Normal Normal Normal

## 2020-08-20 NOTE — Procedures (Signed)
Full Name: Jeffrey Frost Gender: Male MRN #: [id number] Date of Birth: [birth year]    Visit Date: 08/20/2020 08:45 Age: 25 Years Examining Physician: Naomie Dean, MD  Requesting Provider: Rodolph Bong, MD Primary Care Provider:  Shelva Majestic, MD  History: Paresthesias of the hands and legs  Summary: EMG/NCS performed on the right upper and right lower extremities. All nerves and muscles (as indicated in the following tables) were within normal limits.    Conclusion: This is a normal study.   Naomie Dean, M.D.  [facility] [address] [place], [place] [address] Tel: [phone] Fax: [phone]  Verbal informed consent was obtained from the patient, patient was informed of potential risk of procedure, including bruising, bleeding, hematoma formation, infection, muscle weakness, muscle pain, numbness, among others.        MNC    Nerve / Sites Muscle Latency Ref. Amplitude Ref. Rel Amp Segments Distance Velocity Ref. Area    ms ms mV mV %  cm m/s m/s mVms  R Median - APB     Wrist APB 3.5 ?4.4 10.6 ?4.0 100 Wrist - APB 7   41.1     Upper arm APB 7.8  10.3  97.2 Upper arm - Wrist 25 58 ?49 41.4  R Ulnar - ADM     Wrist ADM 2.5 ?3.3 10.9 ?6.0 100 Wrist - ADM 7   40.7     B.Elbow ADM 6.1  10.5  95.6 B.Elbow - Wrist 20 56 ?49 39.8     A.Elbow ADM 7.9  9.1  87.2 A.Elbow - B.Elbow 10 55 ?49 37.1  R Peroneal - EDB     Ankle EDB 4.0 ?6.5 12.1 ?2.0 100 Ankle - EDB 9   51.4     Fib head EDB 10.8  11.5  95 Fib head - Ankle 32 47 ?44 50.8     Pop fossa EDB 12.9  11.5  99.6 Pop fossa - Fib head 10 47 ?44 50.7         Pop fossa - Ankle      R Tibial - AH     Ankle AH 4.0 ?5.8 16.5 ?4.0 100 Ankle - AH 9   48.9     Pop fossa AH 13.4  14.0  84.8 Pop fossa - Ankle 40 43 ?41 47.5             SNC    Nerve / Sites Rec. Site Peak Lat Ref.  Amp Ref. Segments Distance Peak Diff Ref.    ms ms V V  cm ms ms  R Sural - Ankle (Calf)     Calf Ankle 3.6  ?4.4 27 ?6 Calf - Ankle 14    R Superficial peroneal - Ankle     Lat leg Ankle 3.6 ?4.4 16 ?6 Lat leg - Ankle 14    R Median, Ulnar - Transcarpal comparison     Median Palm Wrist 2.1 ?2.2 178 ?35 Median Palm - Wrist 8       Ulnar Palm Wrist 2.1 ?2.2 13 ?12 Ulnar Palm - Wrist 8          Median Palm - Ulnar Palm  0.0 ?0.4  R Median - Orthodromic (Dig II, Mid palm)     Dig II Wrist 2.8 ?3.4 37 ?10 Dig II - Wrist 13    R Ulnar - Orthodromic, (Dig V, Mid palm)     Dig V Wrist 2.6 ?3.1  7 ?5 Dig V - Wrist 11                 F  Wave    Nerve F Lat Ref.   ms ms  R Tibial - AH 54.9 ?56.0  R Ulnar - ADM 28.2 ?32.0         EMG Summary Table    Spontaneous MUAP Recruitment  Muscle IA Fib PSW Fasc Other Amp Dur. Poly Pattern  R. Deltoid Normal None None None _______ Normal Normal Normal Normal  R. Triceps brachii Normal None None None _______ Normal Normal Normal Normal  R. Pronator teres Normal None None None _______ Normal Normal Normal Normal  R. First dorsal interosseous Normal None None None _______ Normal Normal Normal Normal  R. Opponens pollicis Normal None None None _______ Normal Normal Normal Normal  R. Vastus medialis Normal None None None _______ Normal Normal Normal Normal  R. Tibialis anterior Normal None None None _______ Normal Normal Normal Normal  R. Gastrocnemius (Medial head) Normal None None None _______ Normal Normal Normal Normal  R. Biceps femoris (long head) Normal None None None _______ Normal Normal Normal Normal  R. Gluteus maximus Normal None None None _______ Normal Normal Normal Normal  R. Gluteus medius Normal None None None _______ Normal Normal Normal Normal  R. Cervical paraspinals (low) Normal None None None _______ Normal Normal Normal Normal  R. Lumbar paraspinals (low) Normal None None None _______ Normal Normal Normal Normal      

## 2020-08-24 ENCOUNTER — Telehealth: Payer: Self-pay | Admitting: Neurology

## 2020-08-24 NOTE — Telephone Encounter (Signed)
[facility]: [id number] (exp. 08/24/20 to 10/07/20) order sent to GI. They will reach out to the patient to schedule.

## 2020-08-24 NOTE — Telephone Encounter (Signed)
[facility] pending faxed notes

## 2020-08-31 ENCOUNTER — Other Ambulatory Visit: Payer: Self-pay

## 2020-08-31 ENCOUNTER — Ambulatory Visit
Admission: RE | Admit: 2020-08-31 | Discharge: 2020-08-31 | Disposition: A | Discharge status: Home / Self Care | Payer: No Typology Code available for payment source | Source: Ambulatory Visit | Attending: Neurology | Admitting: Neurology

## 2020-08-31 DIAGNOSIS — R202 Paresthesia of skin: Secondary | ICD-10-CM

## 2020-08-31 DIAGNOSIS — R200 Anesthesia of skin: Secondary | ICD-10-CM

## 2020-09-02 ENCOUNTER — Other Ambulatory Visit: Payer: Self-pay | Admitting: Neurology

## 2020-09-02 ENCOUNTER — Telehealth: Payer: Self-pay | Admitting: Neurology

## 2020-09-02 NOTE — Telephone Encounter (Signed)
Patient has transferred his neurologic care to [facility]. He saw a neurologist there today and has been further referred to Dr. Noberto Retort at [facility]. If patient calls for follow up or a new referral at [facility], please discuss with Dr. Lucia Gaskins first as we may not anything more ot offer him. (FYI to referrals team and [facility]) Thanks.

## 2020-09-02 NOTE — Progress Notes (Deleted)
I, Philbert Riser, LAT, ATC acting as a scribe for Clementeen Graham, MD.  Jeffrey Frost is a 25 y.o. male who presents to [facility] at [facility] today for f/u L ankle pain due do syndesmotic ankle sprain that he suffered on 05/28/20 when he rolled his ankle while stepping down from a ladder. Pt was last seen by Dr. Denyse Amass on 07/27/20 and was advised to plan for a trail of return to work. Pt was also referred to neurology for evaluation of numbness/tingling in hands and ankles. Pt continued PT, completing a total of 11 visits. Today, pt reports  Dx imaging: 06/05/20 L ankle & L foot XR  05/29/20 L ankle XR  Pertinent review of systems: ***  Relevant historical information: ***   Exam:  There were no vitals taken for this visit. General: Well Developed, well nourished, and in no acute distress.   MSK: ***    Lab and Radiology Results No results found for this or any previous visit (from the past 72 hour(s)). MR THORACIC SPINE WO CONTRAST  Result Date: 08/31/2020 [facility] [address], [address] [place], [place] [address] [phone] NEUROIMAGING REPORT STUDY DATE: 08/31/2020 PATIENT NAME: Jeffrey Frost DOB: 06-06-1995 MRN: [id number] EXAM: MRI of the thoracic spine ORDERING CLINICIAN: Naomie Dean, MD CLINICAL HISTORY: 25 year old man with numbness and tingling COMPARISON FILMS: None TECHNIQUE: MRI of the thoracic spine was obtained utilizing 3 mm sagittal slices from the posterior fossa from T1 to L2 level with T1, T2 and inversion recovery views. In addition 4 mm axial slices from C6C7 to T12L1 level were included with T2 and gradient echo views. CONTRAST: None IMAGING SITE: [facility], [address] [place], [place], [place] FINDINGS: :  On scout images, the spine is imaged from above the cervicomedullary junction to the lower thoracic spine.  Paravertebral soft tissue appears normal.  The spinal cord is of normal caliber and signal.   The vertebral bodies are  normally aligned.   The vertebral bodies have mildly reduced fat content, though less apparent than the lumbar spine.  The discs and interspaces were further evaluated on axial views from T1 to L2.  Discs and interspaces were normal.  There is no foraminal narrowing or spinal stenosis noted.  No nerve root compression.   This MRI of the thoracic spine shows the following: 1.   Spinal cord is normal. 2.   No degenerative changes are noted.  No spinal stenosis or nerve root compression. 3.   Bone marrow has reduced fat content for his age, though the changes in the thoracic spine appeared less then in the lumbar spine.    This is nonspecific and could be seen with myelodysplastic syndrome and yellow to red marrow reconversion.  Reconversion can occur with hemoglobinopathies, heavy smokers, athletes and prolonged high altitude exposure.  As the changes in the thoracic spine appear less then in the lumbar spine, artifact is also possible. INTERPRETING PHYSICIAN: Richard A. Epimenio Foot, MD, PhD, FAAN Certified in  Neuroimaging by [employer]  MR LUMBAR SPINE WO CONTRAST  Result Date: 08/31/2020  [facility] [address] [place], [place] [address] [phone] NEUROIMAGING REPORT STUDY DATE: 08/31/2020 PATIENT NAME: Jeffrey Frost DOB: [birth year] MRN: [id number] EXAM: MRI of the lumbar spine without contrast ORDERING CLINICIAN: Naomie Dean, MD CLINICAL HISTORY: 25 year old man with numbness and tingling in the legs COMPARISON FILMS: None TECHNIQUE: MRI of the lumbar spine was obtained utilizing 4 mm sagittal slices from T11-12  down to the lower sacrum with T1, T2 and inversion recovery views. In addition 4 mm axial slices from L1-2 down to L5-S1 level were included with T1 and T2 weighted views. CONTRAST: None IMAGING SITE: [facility], [address] [place], [place], [place] FINDINGS: On sagittal images, the spine is imaged from T11 to the sacrum.  There is a 9 mm renal cyst on  the right.  The conus medullaris and cauda equine appear normal.   The vertebral bodies are normally aligned.   There is homogenous reduced fat content in the vertebral body bone marrow. The discs and interspaces were further evaluated on axial views from L1 to S1 as follows: L1 - L2:  The disc and interspace appear normal. L2 - L3:  The disc and interspace appear normal. L3 - L4:  The disc and interspace appear normal. L4 - L5:  The disc and interspace appear normal. L5 - S1:  The disc and interspace appear normal.   This MRI of the lumbar spine without contrast shows the following: 1.   There are no degenerative changes.  No spinal stenosis or nerve root compression. 2.   Bone marrow has reduced fat content for his age.  This is nonspecific and could be seen with myelodysplastic syndrome and yellow to red marrow reconversion.  Reconversion can occur with hemoglobinopathies, heavy smokers, athletes and prolonged high altitude exposure. INTERPRETING PHYSICIAN: Richard A. Epimenio Foot, MD, PhD, FAAN Certified in  Neuroimaging by [employer]       Assessment and Plan: 25 y.o. male with ***   PDMP not reviewed this encounter. No orders of the defined types were placed in this encounter.  No orders of the defined types were placed in this encounter.    Discussed warning signs or symptoms. Please see discharge instructions. Patient expresses understanding.   ***

## 2020-09-03 ENCOUNTER — Other Ambulatory Visit: Payer: Self-pay

## 2020-09-03 ENCOUNTER — Ambulatory Visit: Payer: No Typology Code available for payment source | Admitting: Family Medicine

## 2020-09-03 VITALS — BP 138/70 | HR 62 | Ht 71.5 in | Wt 182.8 lb

## 2020-09-03 DIAGNOSIS — M79672 Pain in left foot: Secondary | ICD-10-CM | POA: Diagnosis not present

## 2020-09-03 DIAGNOSIS — S93492D Sprain of other ligament of left ankle, subsequent encounter: Secondary | ICD-10-CM

## 2020-09-03 NOTE — Patient Instructions (Signed)
Thank you for coming in today.  Continue home exercises   Try adjusting the metatarsal pad.   Hapad metatarsal pads size large.   You can get insoles with metatarsal pads.   If they help great. If they are annoying remove it.   I am trying to treat Mortons neuroma vs metatarsalgia.   For your ankle recheck as needed.   I am happy to help with whatever in the future.    Morton Neuralgia  Morton neuralgia is foot pain that affects the ball of the foot and the area near the toes. Morton neuralgia occurs when part of a nerve in the foot (digital nerve) is under too much pressure (compressed). When this happens over a long period of time, the nerve can thicken (neuroma) and cause pain. Pain usually occurs between the third and fourth toes.  Morton neuralgia can come and go but may get worse over time. What are the causes? This condition is caused by doing the same things over and over with your foot, such as:  Activities such as running or jumping.  Wearing shoes that are too tight. What increases the risk? You may be at higher risk for Morton neuralgia if you:  Are male.  Wear high heels.  Wear shoes that are narrow or tight.  Do activities that repeatedly stretch your toes, such as: ? Running. ? Ballet. ? Long-distance walking. What are the signs or symptoms? The first symptom of Morton neuralgia is pain that spreads from the ball of the foot to the toes. It may feel like you are walking on a marble. Pain usually gets worse with walking and goes away at night. Other symptoms may include numbness and cramping of your toes. Both feet are equally affected, but rarely at the same time. How is this diagnosed? This condition is diagnosed based on your symptoms, your medical history, and a physical exam. Your health care provider may:  Squeeze your foot just behind your toe.  Ask you to move your toes to check for pain.  Ask about your physical activity level. You also may  have imaging tests, such as an X-ray, ultrasound, or MRI. How is this treated? Treatment depends on how severe your condition is and what causes it. Treatment may involve:  Wearing different shoes that are not too tight, are low-heeled, and provide good support. For some people, this is the only treatment needed.  Wearing an over-the-counter or custom supportive pad (orthotic) under the front of your foot.  Getting injections of numbing medicine and anti-inflammatory medicine (steroid) in the nerve.  Having surgery to remove part of the thickened nerve. Follow these instructions at home: Managing pain, stiffness, and swelling  Massage your foot as needed.  Wear orthotics as told by your health care provider.  If directed, put ice on your foot: ? Put ice in a plastic bag. ? Place a towel between your skin and the bag. ? Leave the ice on for 20 minutes, 2-3 times a day.  Avoid activities that cause pain or make pain worse. If you play sports, ask your health care provider when it is safe for you to return to sports.  Raise (elevate) your foot above the level of your heart while lying down and, when possible, while sitting.   General instructions  Take over-the-counter and prescription medicines only as told by your health care provider.  Do not drive or use heavy machinery while taking prescription pain medicine.  Wear shoes that: ? Have  soft soles. ? Have a wide toe area. ? Provide arch support. ? Do not pinch or squeeze your feet. ? Have room for your orthotics, if applicable.  Keep all follow-up visits as told by your health care provider. This is important. Contact a health care provider if:  Your symptoms get worse or do not get better with treatment and home care. Summary  Morton neuralgia is foot pain that affects the ball of the foot and the area near the toes. Pain usually occurs between the third and fourth toes, gets worse with walking, and goes away at  night.  Morton neuralgia occurs when part of a nerve in the foot (digital nerve) is under too much pressure. When this happens over a long period of time, the nerve can thicken (neuroma) and cause pain.  This condition is caused by doing the same things over and over with your foot, such as running or jumping, wearing shoes that are too tight, or wearing high heels.  Treatment may involve wearing low-heeled shoes that are not too tight, wearing a supportive pad (orthotic) under the front of your foot, getting injections in the nerve, or having surgery to remove part of the thickened nerve. This information is not intended to replace advice given to you by your health care provider. Make sure you discuss any questions you have with your health care provider. Document Revised: 05/16/2017 Document Reviewed: 05/16/2017 Elsevier Patient Education  2021 [employer].

## 2020-09-03 NOTE — Progress Notes (Signed)
   I, Philbert Riser, LAT, ATC acting as a scribe for Clementeen Graham, MD.  Vinh Sachs is a 25 y.o. male who presents to [facility] at [facility] today for f/u L syndesmotic ankle sprain that he suffered on 05/28/20 when he rolled his ankle while stepping down from a ladder. Pt was last seen by Dr. Denyse Amass on 07/27/20 and was advised to plan for a trial of return to work and referral to neurology. Today, pt reports the ankle is pretty good overall. Pt reports neurology referred to another Dr. Ulysees Barns at [facility] and is unsure of their findings. Pt reports numbness/tingling has transitioned to pain esp in between toes 1-2. Pain in feet is worse when first stepping down in the morning. Pt reports numbness is radiating proximately til mid-thigh bilat. Pt also reports numbness in UE goes from hands to mid upper arm. Pt reports r/o neuropathy.  Dx imaging: 08/31/20 L-spine & T-spine MRI  05/2120 L ankle & foot XR  05/29/20 L ankle XR  Pertinent review of systems: No fevers or chills  Relevant historical information: Chiari malformation history   Exam:  BP 138/70 (BP Location: Right Arm, Patient Position: Sitting, Cuff Size: Normal)   Pulse 62   Ht 5' 11.5" (1.816 m)   Wt 182 lb 12.8 oz (82.9 kg)   SpO2 99%   BMI 25.14 kg/m  General: Well Developed, well nourished, and in no acute distress.   MSK: Left ankle normal-appearing nontender normal ankle motion. Left foot normal. Mildly tender palpation between first and second toes and metatarsal heads.  Negative squeeze test. Plantar metatarsal heads are nontender.     Assessment and Plan: 25 y.o. male with left high ankle sprain significantly improved.  Continue home exercise program.  Watchful waiting for this issue.  Left foot pain patient may have suffered a peripheral nerve injury as part of the ankle sprain Marlise nerve irritation.  He may have a Morton's neuroma or developing metatarsalgia.  Plan to treat with  metatarsal pad and recheck as needed if not improved.  Additionally he is in the process of being worked up for paresthesias by now 2 or 3 different neurologists.  He has had extensive work-up including neuroimaging that has been nondiagnostic.  He has been referred to [facility] for the Chiari malformation clinic for further evaluation.     Discussed warning signs or symptoms. Please see discharge instructions. Patient expresses understanding.   The above documentation has been reviewed and is accurate and complete Clementeen Graham, M.D.

## 2020-09-07 ENCOUNTER — Ambulatory Visit: Payer: No Typology Code available for payment source | Admitting: Family Medicine

## 2020-09-09 NOTE — Telephone Encounter (Addendum)
Pt has called stating that he has been told that the process in order for him to be seen at [name] is that he would 1st need MRI's of his brain & cervical spine. With and without contrast with enhancements.  Pt is wanting to know if Dr Lucia Gaskins is willing to schedule the needed MRI's.

## 2020-09-09 NOTE — Telephone Encounter (Signed)
I referred him to [facility] for a second opinion and he already saw them. Whoever referred him to [name] should probably order the MRIs. thanks

## 2020-09-09 NOTE — Telephone Encounter (Signed)
I referred him to [facility] and he already was seen there. Correct?

## 2020-09-09 NOTE — Telephone Encounter (Signed)
Correct, he saw them 09/02/20. I don't see who referred him to [facility].

## 2020-09-09 NOTE — Telephone Encounter (Signed)
That seems very reasonable to me. Due to patient's complicated medical history he tends to have some health related anxiety. I completely agree that if [facility] placed referral that they should set up needed imaging (more likely to get approved if coming from neurologist anyway)

## 2020-09-09 NOTE — Telephone Encounter (Signed)
Thanks, at this time he has seen 2 neurologists and a neurosurgeon I'm not sure I can justify another referral when extensive workup to date by all 3 have been negative. I would suggest he return to his primary care, no more follow up from [facility]. If [facility] would like to send him to [facility] then he should arrange everything [name] needs and referral through [facility] thanks (Dr. Joellyn Quails)

## 2020-09-09 NOTE — Telephone Encounter (Signed)
I spoke with the patient. He saw Dr Ulysees Barns at [facility] on 09/02/2020.  The patient states [name] was recommended, specifically Dr. Sonia Baller.  He was given their contact information but he was not under the impression that a referral was actually sent.  I checked the office note from [facility] and it does look like a referral was ordered although there are no details as to where the referral is supposed to go other than neurology.  The patient is going to call [facility] for more information and if they indeed ordered the referral he will let them know that MRIs are required by [name].  He will call us back if they do not refer him to [facility] so we will be updated.

## 2020-09-15 ENCOUNTER — Telehealth: Payer: Self-pay | Admitting: Neurology

## 2020-09-15 NOTE — Telephone Encounter (Signed)
I spoke with the patient and discussed the message below.  I also advised patient if he is worse he can go to the hospital to be checked.  The patient stated he would need to pick up his MRI CDs from our office.  I let him know I would check with Dr Lucia Gaskins and medical records and give him a call back.

## 2020-09-15 NOTE — Telephone Encounter (Signed)
Dr Lucia Gaskins had already transferred care to [facility] and advised no further follow up at [facility]. Also return to primary care.

## 2020-09-15 NOTE — Telephone Encounter (Signed)
Pt is asking for a call from Dr Lucia Gaskins to discuss his numbness in limbs are worsening.  Pt declined sending a mychart message or scheduling an appointment

## 2020-09-16 NOTE — Telephone Encounter (Signed)
I gave the CDs he brought to me to [facility] to return to patient.  Toma Copier stated she gave them to you. Those do not include any imaging he had recently or at [facility] imaging they are just the CDs he brought to me thanks

## 2020-09-24 ENCOUNTER — Ambulatory Visit: Payer: No Typology Code available for payment source | Admitting: Family Medicine

## 2020-09-29 ENCOUNTER — Other Ambulatory Visit: Payer: Self-pay

## 2020-09-29 ENCOUNTER — Encounter: Payer: Self-pay | Admitting: Family Medicine

## 2020-09-29 ENCOUNTER — Ambulatory Visit: Payer: No Typology Code available for payment source | Admitting: Family Medicine

## 2020-09-29 VITALS — BP 118/70 | HR 78 | Ht 71.5 in | Wt 195.4 lb

## 2020-09-29 DIAGNOSIS — R202 Paresthesia of skin: Secondary | ICD-10-CM | POA: Diagnosis not present

## 2020-09-29 DIAGNOSIS — G935 Compression of brain: Secondary | ICD-10-CM

## 2020-09-29 NOTE — Progress Notes (Signed)
I, Christoper Fabian, LAT, ATC, am serving as scribe for Dr. Clementeen Graham.  Jeffrey Frost is a 25 y.o. male who presents to [facility] today for f/u of metatarsalgia and L ankle pain due to a syndesmotic sprain that occurred on 05/28/20 when he rolled his ankle while stepping down from a ladder.  He was last seen by Dr. Denyse Amass on 09/03/20 w/ main c/o being metatarsalgia and numbness/tingling in his feet radiating up to his B thighs.  He was shown MT pads and advised to use those in his shoes to help w/ the numbness/tingling between his 1st and 2nd toes.  He is being followed by multiple neurologists for his paresthesias.  Since his last visit, pt reports that his L foot numbness/tingling remains unchanged.  He states that he will be moving to [place], [place] next week and is trying to get into the [facility] clinic down there.    Diagnostic testing: L-spine and T-spine MRI- 08/31/20; L ankle and foot XR- 06/05/20; L ankle XR- 05/29/20  Pertinent review of systems: No fevers or chills  Relevant historical information: Chiari I malformation   Exam:  BP 118/70 (BP Location: Right Arm, Patient Position: Sitting, Cuff Size: Normal)   Pulse 78   Ht 5' 11.5" (1.816 m)   Wt 195 lb 6.4 oz (88.6 kg)   SpO2 98%   BMI 26.87 kg/m  General: Well Developed, well nourished, and in no acute distress.   MSK: Normal gait.  Normal left ankle motion.    Lab and Radiology Results MR THORACIC SPINE WO CONTRAST  Result Date: 08/31/2020 [facility] [address], [address] [place], [place] [address] [phone] NEUROIMAGING REPORT STUDY DATE: 08/31/2020 PATIENT NAME: Jeffrey Frost DOB: 21-Mar-1996 MRN: [id number] EXAM: MRI of the thoracic spine ORDERING CLINICIAN: Naomie Dean, MD CLINICAL HISTORY: 25 year old man with numbness and tingling COMPARISON FILMS: None TECHNIQUE: MRI of the thoracic spine was obtained utilizing 3 mm sagittal slices from the posterior fossa from T1 to L2 level with  T1, T2 and inversion recovery views. In addition 4 mm axial slices from C6C7 to T12L1 level were included with T2 and gradient echo views. CONTRAST: None IMAGING SITE: [facility], [address] [place], [place], [place] FINDINGS: :  On scout images, the spine is imaged from above the cervicomedullary junction to the lower thoracic spine.  Paravertebral soft tissue appears normal.  The spinal cord is of normal caliber and signal.   The vertebral bodies are normally aligned.   The vertebral bodies have mildly reduced fat content, though less apparent than the lumbar spine.  The discs and interspaces were further evaluated on axial views from T1 to L2.  Discs and interspaces were normal.  There is no foraminal narrowing or spinal stenosis noted.  No nerve root compression.   This MRI of the thoracic spine shows the following: 1.   Spinal cord is normal. 2.   No degenerative changes are noted.  No spinal stenosis or nerve root compression. 3.   Bone marrow has reduced fat content for his age, though the changes in the thoracic spine appeared less then in the lumbar spine.    This is nonspecific and could be seen with myelodysplastic syndrome and yellow to red marrow reconversion.  Reconversion can occur with hemoglobinopathies, heavy smokers, athletes and prolonged high altitude exposure.  As the changes in the thoracic spine appear less then in the lumbar spine, artifact is also possible. INTERPRETING PHYSICIAN: Richard A. Sater,  MD, PhD, FAAN Certified in  Neuroimaging by American Society of Neuroimaging  MR LUMBAR SPINE WO CONTRAST  Result Date: 08/31/2020  [facility] [address], [address] [place], [place] [address] [phone] NEUROIMAGING REPORT STUDY DATE: 08/31/2020 PATIENT NAME: Jeffrey PitcherDavid Frost DOB: 08/07/1995 MRN: [id number] EXAM: MRI of the lumbar spine without contrast ORDERING CLINICIAN: Naomie DeanAntonia Ahern, MD CLINICAL HISTORY: 25 year old man with numbness and tingling in the legs COMPARISON  FILMS: None TECHNIQUE: MRI of the lumbar spine was obtained utilizing 4 mm sagittal slices from T11-12 down to the lower sacrum with T1, T2 and inversion recovery views. In addition 4 mm axial slices from L1-2 down to L5-S1 level were included with T1 and T2 weighted views. CONTRAST: None IMAGING SITE:  imaging, [address], [place], [place] FINDINGS: On sagittal images, the spine is imaged from T11 to the sacrum.  There is a 9 mm renal cyst on the right.  The conus medullaris and cauda equine appear normal.   The vertebral bodies are normally aligned.   There is homogenous reduced fat content in the vertebral body bone marrow. The discs and interspaces were further evaluated on axial views from L1 to S1 as follows: L1 - L2:  The disc and interspace appear normal. L2 - L3:  The disc and interspace appear normal. L3 - L4:  The disc and interspace appear normal. L4 - L5:  The disc and interspace appear normal. L5 - S1:  The disc and interspace appear normal.   This MRI of the lumbar spine without contrast shows the following: 1.   There are no degenerative changes.  No spinal stenosis or nerve root compression. 2.   Bone marrow has reduced fat content for his age.  This is nonspecific and could be seen with myelodysplastic syndrome and yellow to red marrow reconversion.  Reconversion can occur with hemoglobinopathies, heavy smokers, athletes and prolonged high altitude exposure. INTERPRETING PHYSICIAN: Richard A. Epimenio FootSater, MD, PhD, FAAN Certified in  Neuroimaging by [employer]   NCV with EMG(electromyography)  Result Date: 08/20/2020 Anson FretAhern, Antonia B, MD     08/20/2020 12:30 PM   Full Name: Jeffrey PitcherDavid Frost Gender: Male MRN #: [id number] Date of Birth: 01/08/1996   Visit Date: 08/20/2020 08:45 Age: [age] Years Examining Physician: Naomie DeanAntonia Ahern, MD Requesting Provider: Rodolph Bongorey, Alizay Bronkema S, MD Primary Care Provider:  Shelva MajesticHunter, Stephen O, MD History: Paresthesias of the hands and legs Summary: EMG/NCS  performed on the right upper and right lower extremities. All nerves and muscles (as indicated in the following tables) were within normal limits.  Conclusion: This is a normal study. Naomie DeanAntonia Ahern, M.D. [facility] [address], [address] [place], [place] [address] Tel: [phone] Fax: [phone] Verbal informed consent was obtained from the patient, patient was informed of potential risk of procedure, including bruising, bleeding, hematoma formation, infection, muscle weakness, muscle pain, numbness, among others.     MNC   Nerve / Sites Muscle Latency Ref. Amplitude Ref. Rel Amp Segments Distance Velocity Ref. Area   ms ms mV mV %  cm m/s m/s mVms R Median - APB    Wrist APB 3.5 ?4.4 10.6 ?4.0 100 Wrist - APB 7   41.1    Upper arm APB 7.8  10.3  97.2 Upper arm - Wrist 25 58 ?49 41.4 R Ulnar - ADM    Wrist ADM 2.5 ?3.3 10.9 ?6.0 100 Wrist - ADM 7   40.7    B.Elbow ADM 6.1  10.5  95.6  B.Elbow - Wrist 20 56 ?49 39.8    A.Elbow ADM 7.9  9.1  87.2 A.Elbow - B.Elbow 10 55 ?49 37.1 R Peroneal - EDB    Ankle EDB 4.0 ?6.5 12.1 ?2.0 100 Ankle - EDB 9   51.4    Fib head EDB 10.8  11.5  95 Fib head - Ankle 32 47 ?44 50.8    Pop fossa EDB 12.9  11.5  99.6 Pop fossa - Fib head 10 47 ?44 50.7        Pop fossa - Ankle     R Tibial - AH    Ankle AH 4.0 ?5.8 16.5 ?4.0 100 Ankle - AH 9   48.9    Pop fossa AH 13.4  14.0  84.8 Pop fossa - Ankle 40 43 ?41 47.5           SNC   Nerve / Sites Rec. Site Peak Lat Ref.  Amp Ref. Segments Distance Peak Diff Ref.   ms ms V V  cm ms ms R Sural - Ankle (Calf)    Calf Ankle 3.6 ?4.4 27 ?6 Calf - Ankle 14   R Superficial peroneal - Ankle    Lat leg Ankle 3.6 ?4.4 16 ?6 Lat leg - Ankle 14   R Median, Ulnar - Transcarpal comparison    Median Palm Wrist 2.1 ?2.2 178 ?35 Median Palm - Wrist 8      Ulnar Palm Wrist 2.1 ?2.2 13 ?12 Ulnar Palm - Wrist 8         Median Palm - Ulnar Palm  0.0 ?0.4 R Median - Orthodromic (Dig II, Mid palm)    Dig II Wrist 2.8 ?3.4 37 ?10 Dig II - Wrist  13   R Ulnar - Orthodromic, (Dig V, Mid palm)    Dig V Wrist 2.6 ?3.1 7 ?5 Dig V - Wrist 35               F  Wave   Nerve F Lat Ref.  ms ms R Tibial - AH 54.9 ?56.0 R Ulnar - ADM 28.2 ?32.0       EMG Summary Table   Spontaneous MUAP Recruitment Muscle IA Fib PSW Fasc Other Amp Dur. Poly Pattern R. Deltoid Normal None None None _______ Normal Normal Normal Normal R. Triceps brachii Normal None None None _______ Normal Normal Normal Normal R. Pronator teres Normal None None None _______ Normal Normal Normal Normal R. First dorsal interosseous Normal None None None _______ Normal Normal Normal Normal R. Opponens pollicis Normal None None None _______ Normal Normal Normal Normal R. Vastus medialis Normal None None None _______ Normal Normal Normal Normal R. Tibialis anterior Normal None None None _______ Normal Normal Normal Normal R. Gastrocnemius (Medial head) Normal None None None _______ Normal Normal Normal Normal R. Biceps femoris (long head) Normal None None None _______ Normal Normal Normal Normal R. Gluteus maximus Normal None None None _______ Normal Normal Normal Normal R. Gluteus medius Normal None None None _______ Normal Normal Normal Normal R. Cervical paraspinals (low) Normal None None None _______ Normal Normal Normal Normal R. Lumbar paraspinals (low) Normal None None None _______ Normal Normal Normal Normal         Assessment and Plan: 25 y.o. male with paresthesias.  Unclear etiology.  Patient has had extensive neurologic work-up so far with no clear etiology.  He had a brain and C-spine MRI in [place] which was reassuring that his Chiari malformation is not the cause.  However he is still in the process of being seen by a [facility] specialist in this area but is now moving to the [place] area.  It is important for him to follow-up with good neurologic care to continue work-up in [place].  If he should need a referral I am happy to provide referral.  Always happy to see him back if he moves back  to this area.  Recheck back as needed.     Discussed warning signs or symptoms. Please see discharge instructions. Patient expresses understanding.   The above documentation has been reviewed and is accurate and complete Clementeen Graham, M.D.   Total encounter time 20 minutes including face-to-face time with the patient and, reviewing past medical record, and charting on the date of service.   Discussed treatment plan and options

## 2020-09-29 NOTE — Patient Instructions (Addendum)
Thank you for coming in today.  Let me know if you need anything in [place].   Keep me updated.

## 2021-06-15 ENCOUNTER — Encounter: Payer: No Typology Code available for payment source | Admitting: Family Medicine

## 2022-01-15 IMAGING — DX DG ANKLE COMPLETE 3+V*L*
3 series · 3 of 3 positions shown · non-contrast
Comparison: None.

CLINICAL DATA: Foot and ankle pain history of injury

EXAM:
LEFT ANKLE COMPLETE - 3+ VIEW

[ankle ap]
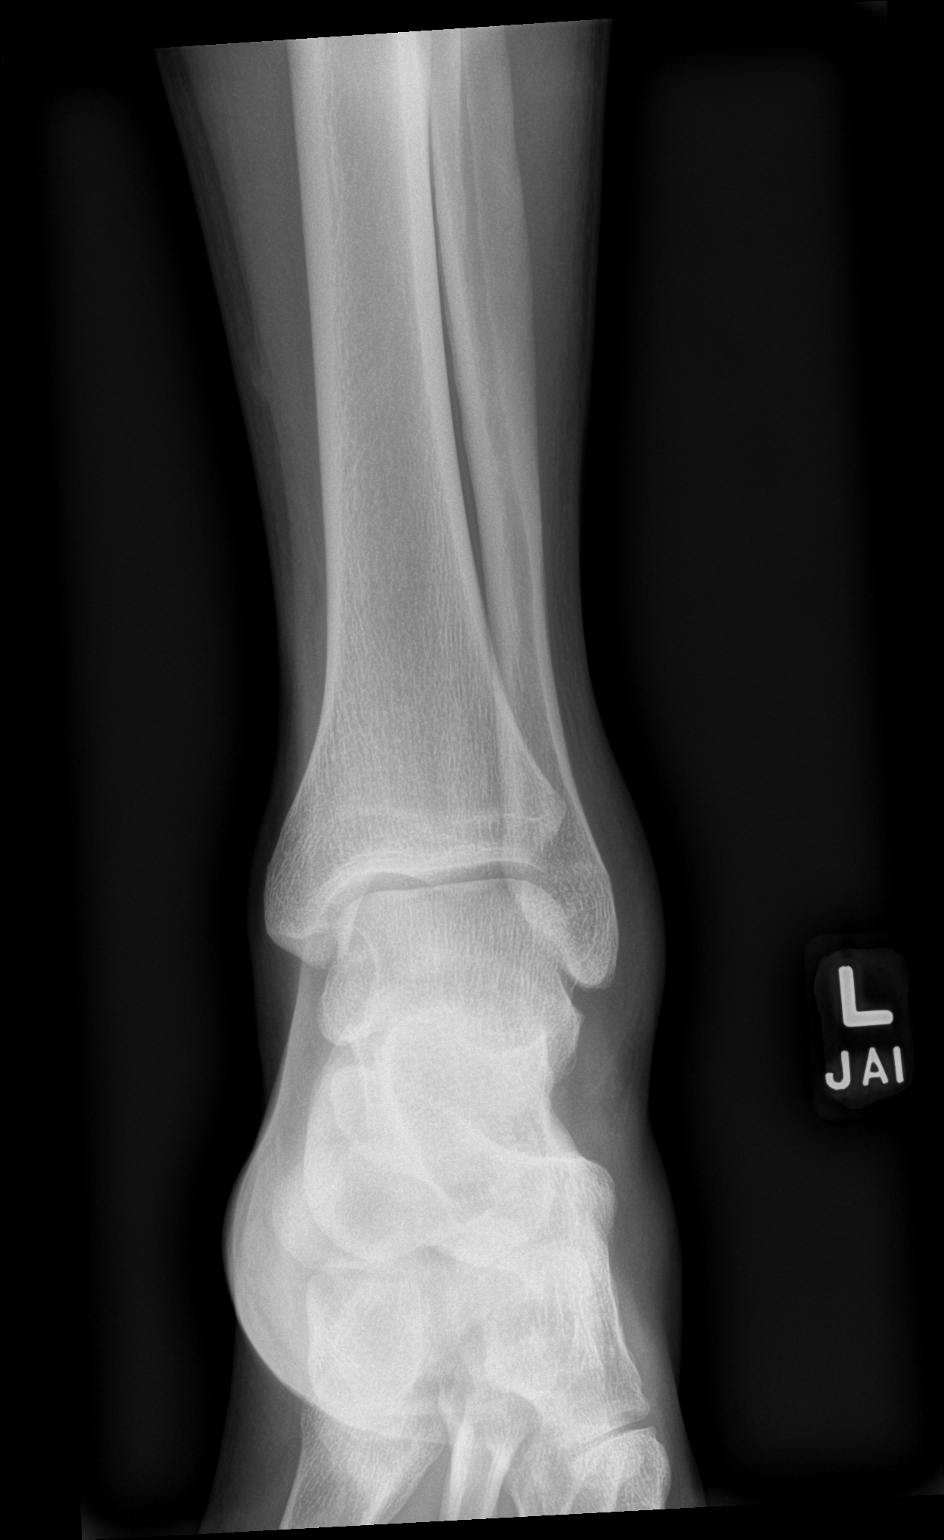

[ankle obl]
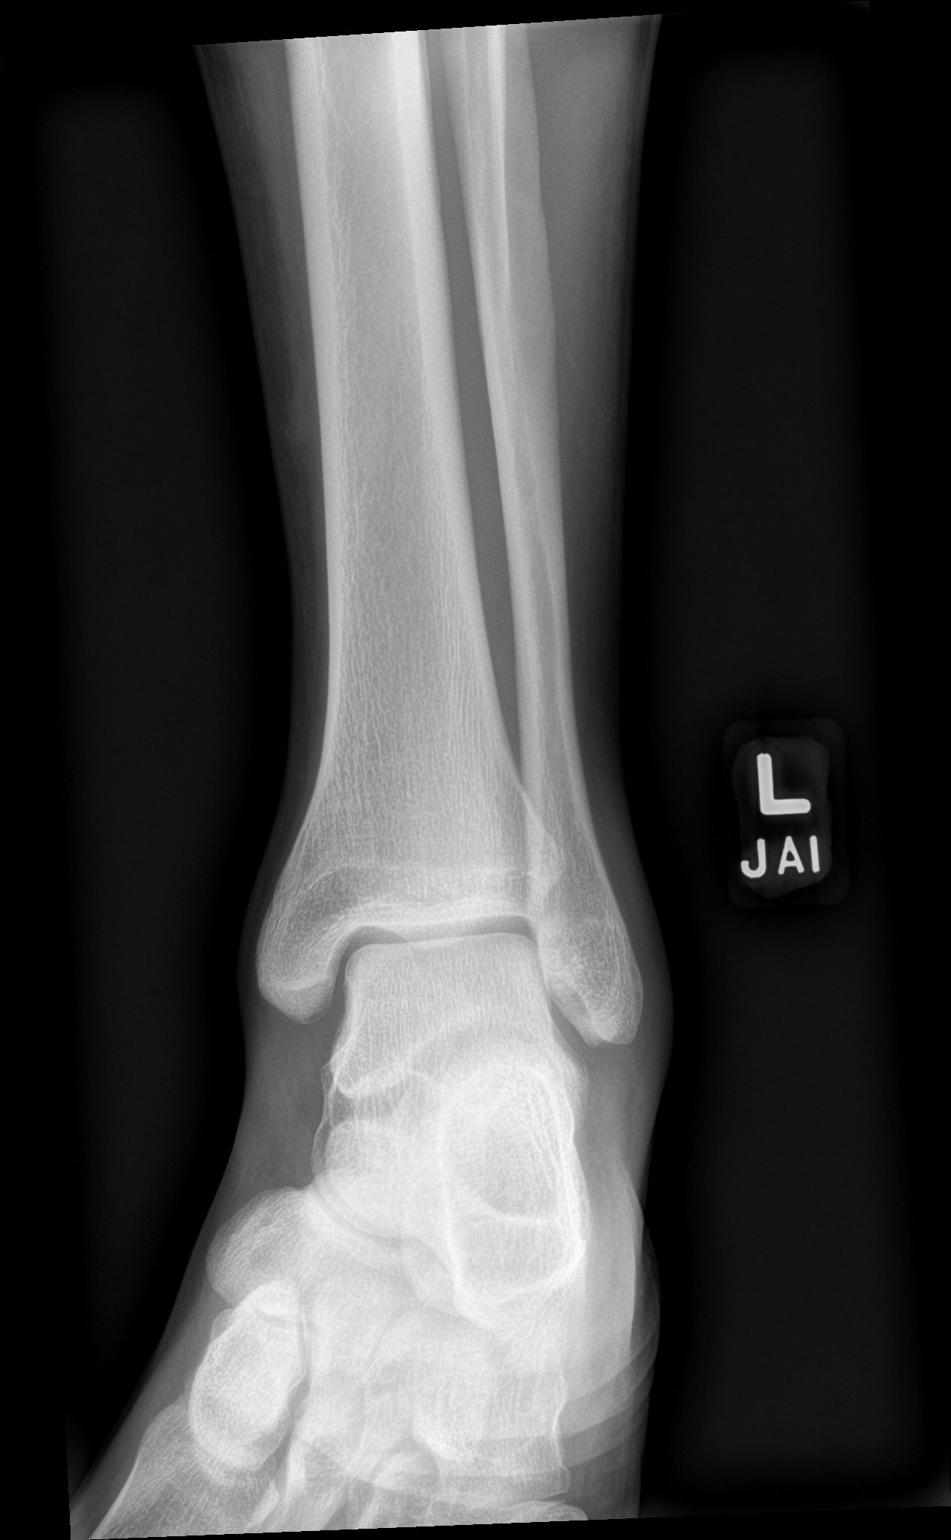

[ankle lat]
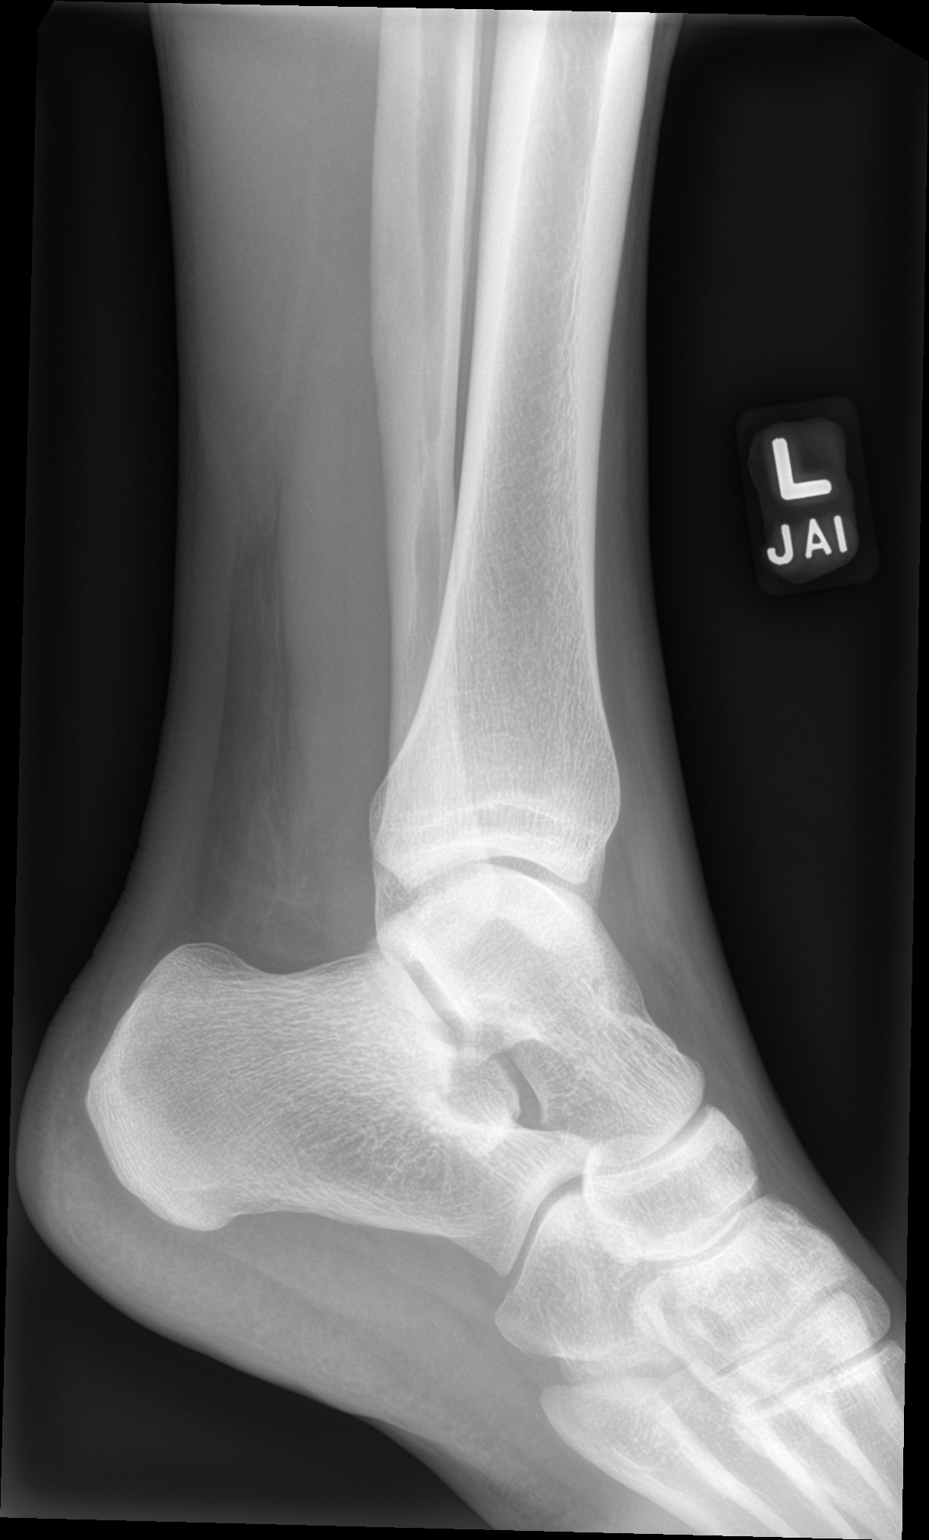

[3 of 3 positions shown; findings below may reference images not displayed]

FINDINGS: There is no evidence of fracture, dislocation, or joint effusion.
There is no evidence of arthropathy or other focal bone abnormality.
Soft tissues are unremarkable.
IMPRESSION: Negative.

## 2022-01-15 IMAGING — DX DG FOOT COMPLETE 3+V*L*
3 series · 3 of 3 positions shown · non-contrast
Comparison: None.

CLINICAL DATA: Acute left foot pain after injury last week.

EXAM:
LEFT FOOT - COMPLETE 3+ VIEW

[foot ap]
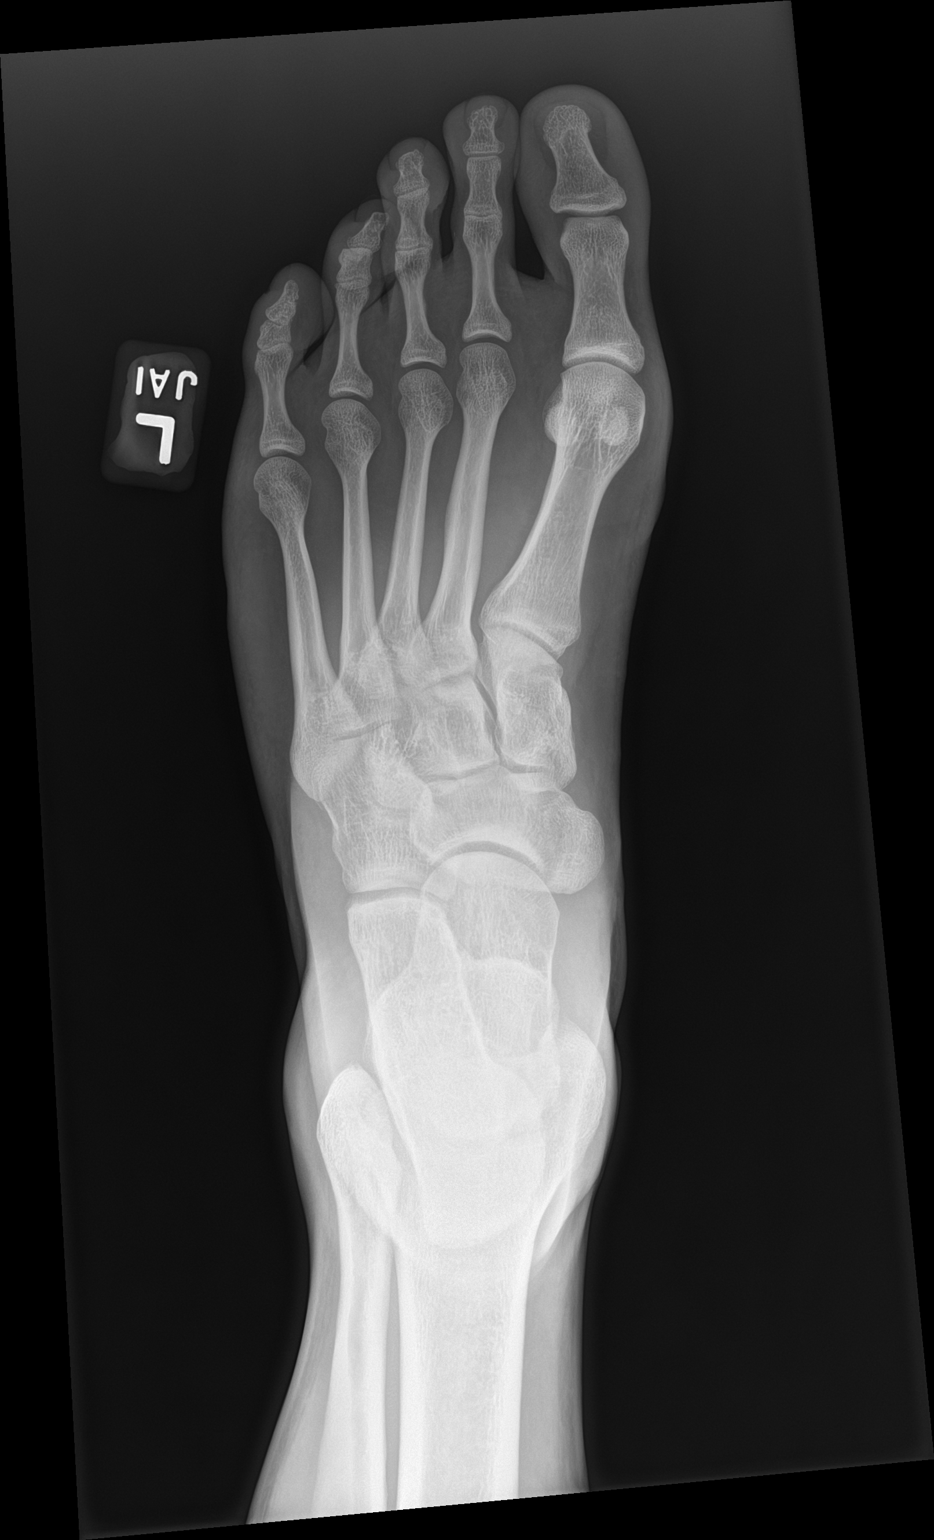

[foot obl]
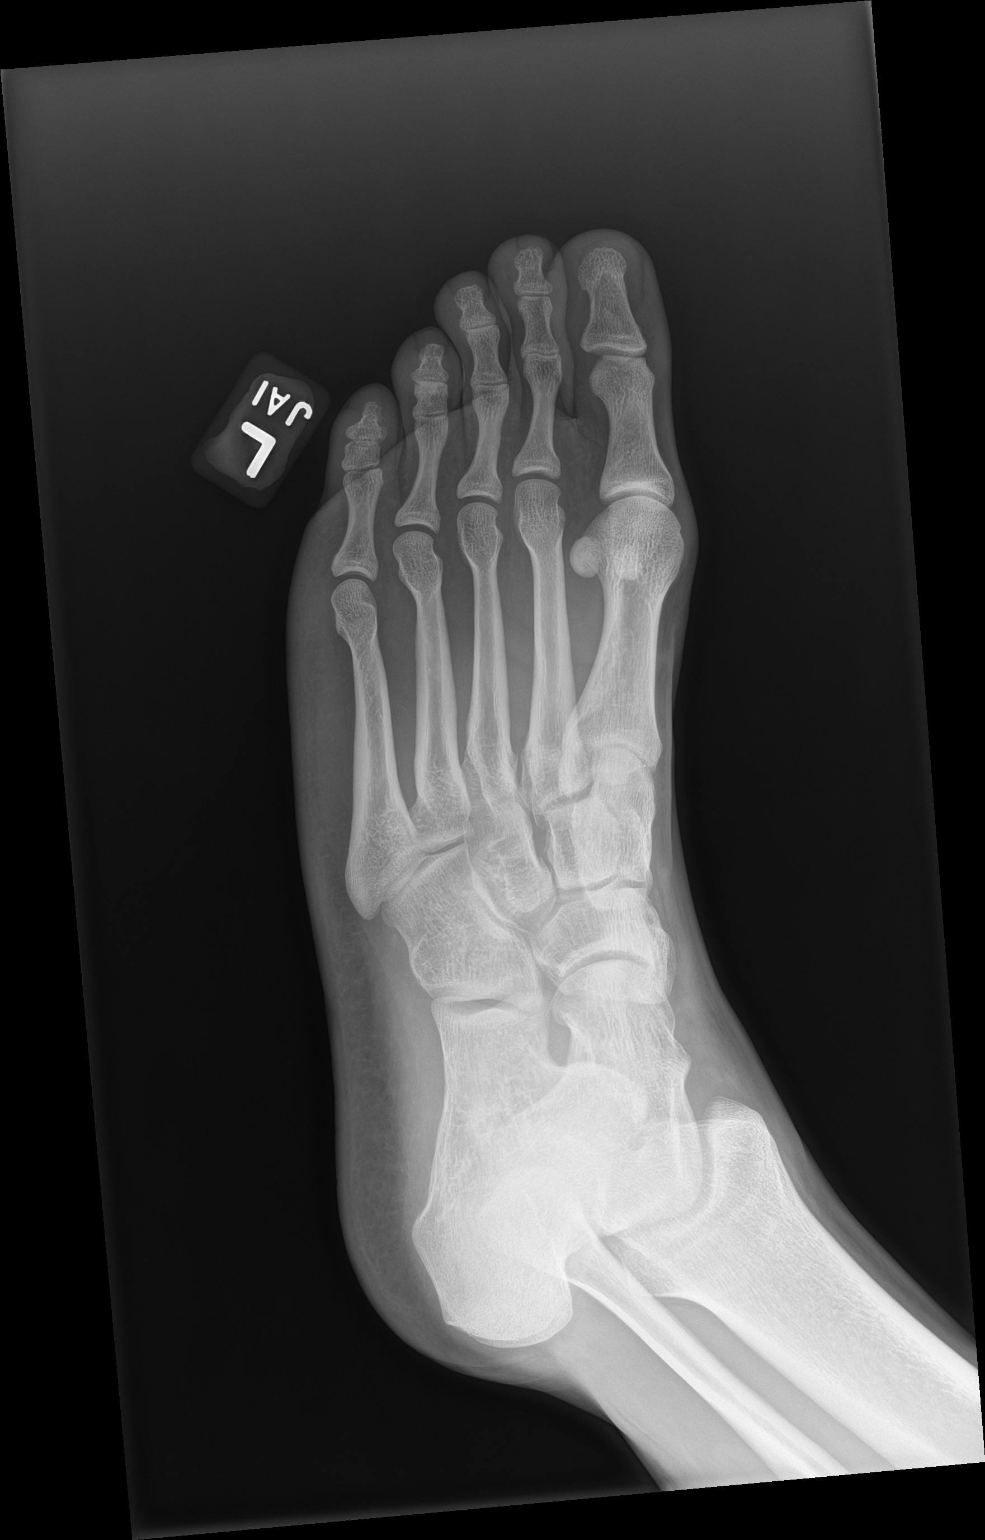

[foot lat]
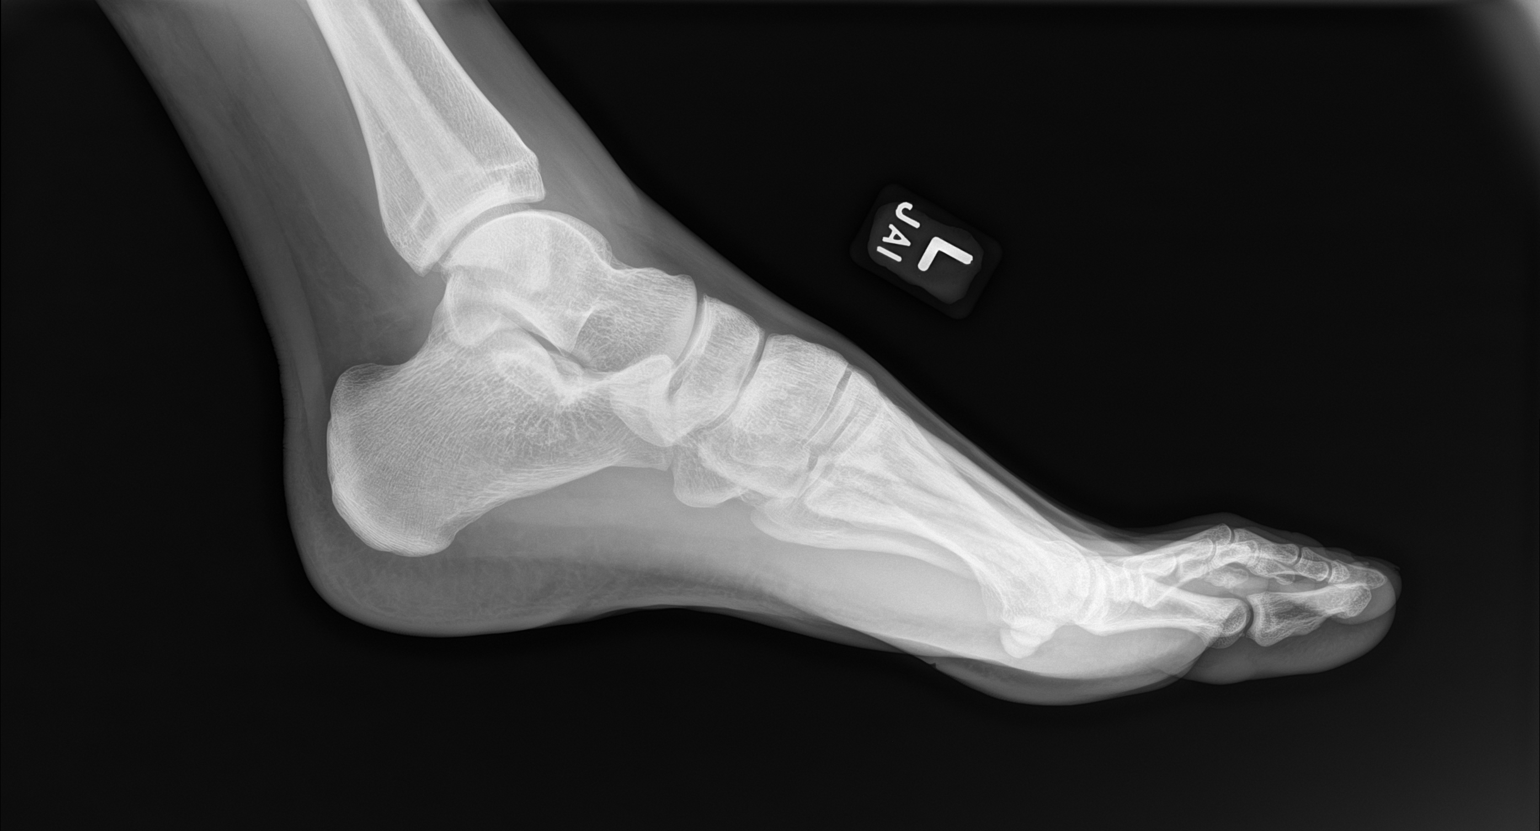

[3 of 3 positions shown; findings below may reference images not displayed]

FINDINGS: There is no evidence of fracture or dislocation. There is no
evidence of arthropathy or other focal bone abnormality. Soft
tissues are unremarkable.
IMPRESSION: Negative.

## 2023-02-27 NOTE — Telephone Encounter (Signed)
Note opened in error.
